# Patient Record
Sex: Male | Born: 1955
Health system: Southern US, Community
[De-identification: ages and names within clinical notes are randomized; demographics above are authoritative.]

## PROBLEM LIST (undated history)

## (undated) DIAGNOSIS — K219 Gastro-esophageal reflux disease without esophagitis: Secondary | ICD-10-CM

## (undated) DIAGNOSIS — E7800 Pure hypercholesterolemia, unspecified: Secondary | ICD-10-CM

## (undated) DIAGNOSIS — E119 Type 2 diabetes mellitus without complications: Secondary | ICD-10-CM

## (undated) DIAGNOSIS — I10 Essential (primary) hypertension: Secondary | ICD-10-CM

## (undated) HISTORY — PX: OTHER SURGICAL HISTORY: SHX169

## (undated) HISTORY — PX: ABDOMINOPLASTY: SUR9

## (undated) HISTORY — DX: Essential (primary) hypertension: I10

## (undated) HISTORY — PX: SPINE SURGERY: SHX786

## (undated) HISTORY — DX: Gastro-esophageal reflux disease without esophagitis: K21.9

## (undated) HISTORY — PX: BACK SURGERY: SHX140

---

## 2001-06-25 ENCOUNTER — Emergency Department (HOSPITAL_COMMUNITY): Admission: EM | Admit: 2001-06-25 | Discharge: 2001-06-25 | Payer: Self-pay | Admitting: Internal Medicine

## 2001-06-25 ENCOUNTER — Encounter: Payer: Self-pay | Admitting: Internal Medicine

## 2001-06-26 ENCOUNTER — Emergency Department (HOSPITAL_COMMUNITY): Admission: EM | Admit: 2001-06-26 | Discharge: 2001-06-26 | Payer: Self-pay

## 2001-06-28 ENCOUNTER — Encounter: Payer: Self-pay | Admitting: Orthopedic Surgery

## 2001-06-28 ENCOUNTER — Inpatient Hospital Stay (HOSPITAL_COMMUNITY): Admission: RE | Admit: 2001-06-28 | Discharge: 2001-07-01 | Payer: Self-pay | Admitting: Orthopedic Surgery

## 2001-06-28 ENCOUNTER — Encounter (INDEPENDENT_AMBULATORY_CARE_PROVIDER_SITE_OTHER): Payer: Self-pay | Admitting: Specialist

## 2003-06-20 ENCOUNTER — Emergency Department (HOSPITAL_COMMUNITY): Admission: EM | Admit: 2003-06-20 | Discharge: 2003-06-21 | Payer: Self-pay | Admitting: *Deleted

## 2009-08-16 ENCOUNTER — Ambulatory Visit (HOSPITAL_COMMUNITY): Admission: RE | Admit: 2009-08-16 | Discharge: 2009-08-16 | Payer: Self-pay | Admitting: Family Medicine

## 2010-01-09 ENCOUNTER — Encounter: Payer: Self-pay | Admitting: Internal Medicine

## 2010-01-17 ENCOUNTER — Ambulatory Visit (HOSPITAL_COMMUNITY): Admission: RE | Admit: 2010-01-17 | Discharge: 2010-01-17 | Payer: Self-pay | Admitting: Internal Medicine

## 2010-01-17 ENCOUNTER — Ambulatory Visit: Payer: Self-pay | Admitting: Internal Medicine

## 2010-09-16 NOTE — Letter (Signed)
Summary: Internal Other Domingo Dimes  Internal Other Domingo Dimes   Imported By: Cloria Spring LPN 16/05/9603 54:09:81  _____________________________________________________________________  External Attachment:    Type:   Image     Comment:   External Document

## 2010-11-03 LAB — GLUCOSE, CAPILLARY: Glucose-Capillary: 99 mg/dL (ref 70–99)

## 2010-11-10 ENCOUNTER — Other Ambulatory Visit: Payer: Self-pay | Admitting: Family Medicine
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
## 2012-07-01 ENCOUNTER — Ambulatory Visit: Payer: Self-pay | Admitting: Family Medicine

## 2012-07-01 ENCOUNTER — Encounter: Payer: Self-pay | Admitting: Family Medicine

## 2014-12-14 ENCOUNTER — Other Ambulatory Visit (HOSPITAL_COMMUNITY): Payer: Self-pay | Admitting: Family Medicine

## 2014-12-14 ENCOUNTER — Ambulatory Visit (HOSPITAL_COMMUNITY)
Admission: RE | Admit: 2014-12-14 | Discharge: 2014-12-14 | Disposition: A | Discharge status: Home / Self Care | Payer: BLUE CROSS/BLUE SHIELD | Source: Ambulatory Visit | Attending: Family Medicine | Admitting: Family Medicine

## 2014-12-14 DIAGNOSIS — M5432 Sciatica, left side: Secondary | ICD-10-CM

## 2014-12-14 DIAGNOSIS — S73102A Unspecified sprain of left hip, initial encounter: Secondary | ICD-10-CM

## 2014-12-14 DIAGNOSIS — Y939 Activity, unspecified: Secondary | ICD-10-CM | POA: Insufficient documentation

## 2014-12-14 DIAGNOSIS — M25552 Pain in left hip: Secondary | ICD-10-CM | POA: Insufficient documentation

## 2015-02-12 ENCOUNTER — Encounter: Payer: Self-pay | Admitting: Orthopedic Surgery

## 2016-10-02 DIAGNOSIS — I10 Essential (primary) hypertension: Secondary | ICD-10-CM | POA: Diagnosis not present

## 2016-10-02 DIAGNOSIS — E782 Mixed hyperlipidemia: Secondary | ICD-10-CM | POA: Diagnosis not present

## 2016-10-02 DIAGNOSIS — E1165 Type 2 diabetes mellitus with hyperglycemia: Secondary | ICD-10-CM | POA: Diagnosis not present

## 2016-10-16 DIAGNOSIS — E1165 Type 2 diabetes mellitus with hyperglycemia: Secondary | ICD-10-CM | POA: Diagnosis not present

## 2016-11-27 DIAGNOSIS — Z1389 Encounter for screening for other disorder: Secondary | ICD-10-CM | POA: Diagnosis not present

## 2016-11-27 DIAGNOSIS — I10 Essential (primary) hypertension: Secondary | ICD-10-CM | POA: Diagnosis not present

## 2016-11-27 DIAGNOSIS — E782 Mixed hyperlipidemia: Secondary | ICD-10-CM | POA: Diagnosis not present

## 2016-11-27 DIAGNOSIS — E1165 Type 2 diabetes mellitus with hyperglycemia: Secondary | ICD-10-CM | POA: Diagnosis not present

## 2018-04-29 DIAGNOSIS — E6609 Other obesity due to excess calories: Secondary | ICD-10-CM | POA: Diagnosis not present

## 2018-04-29 DIAGNOSIS — E782 Mixed hyperlipidemia: Secondary | ICD-10-CM | POA: Diagnosis not present

## 2018-04-29 DIAGNOSIS — E119 Type 2 diabetes mellitus without complications: Secondary | ICD-10-CM | POA: Diagnosis not present

## 2018-04-29 DIAGNOSIS — E1121 Type 2 diabetes mellitus with diabetic nephropathy: Secondary | ICD-10-CM | POA: Diagnosis not present

## 2018-04-29 DIAGNOSIS — I10 Essential (primary) hypertension: Secondary | ICD-10-CM | POA: Diagnosis not present

## 2018-11-04 DIAGNOSIS — E6609 Other obesity due to excess calories: Secondary | ICD-10-CM | POA: Diagnosis not present

## 2018-11-04 DIAGNOSIS — I10 Essential (primary) hypertension: Secondary | ICD-10-CM | POA: Diagnosis not present

## 2018-11-04 DIAGNOSIS — E119 Type 2 diabetes mellitus without complications: Secondary | ICD-10-CM | POA: Diagnosis not present

## 2019-06-27 ENCOUNTER — Other Ambulatory Visit: Payer: Self-pay

## 2019-06-27 ENCOUNTER — Ambulatory Visit
Admission: EM | Admit: 2019-06-27 | Discharge: 2019-06-27 | Disposition: A | Discharge status: Home / Self Care | Payer: 59

## 2019-06-27 DIAGNOSIS — M79601 Pain in right arm: Secondary | ICD-10-CM

## 2019-06-27 DIAGNOSIS — M25521 Pain in right elbow: Secondary | ICD-10-CM

## 2019-06-27 HISTORY — DX: Type 2 diabetes mellitus without complications: E11.9

## 2019-06-27 HISTORY — DX: Pure hypercholesterolemia, unspecified: E78.00

## 2019-06-27 MED ORDER — PREDNISONE 20 MG PO TABS: 20.0000 mg | ORAL_TABLET | Freq: Two times a day (BID) | ORAL | 0 refills | Status: AC
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
## 2019-06-27 NOTE — ED Provider Notes (Signed)
[place]   [id number] 06/27/19 Arrival Time: 1010  CC: RT arm pain  SUBJECTIVE: History from: patient. Stanley Long is a 63 y.o. male hx significant for DM, complains of right upper arm pain that began 1 day ago.  Denies a precipitating event or specific injury, however, speculates he may have slept on it wrong. Also admits to moving a deer that weight approximately 220 lbs 4 days ago.  Localizes the pain to the upper arm and outside of elbow.  Describes the pain as constant and "locking" in character.  Has NOT tried OTC medications.  Symptoms are made worse with ROM about the elbow.  Denies similar symptoms in the past.  Complains of swelling, and weakness.  Denies fever, chills, erythema, ecchymosis, numbness and tingling.    ROS: As per HPI.  All other pertinent ROS negative.     Past Medical History:  Diagnosis Date  . Diabetes mellitus without complication ([place])   . Hypercholesteremia    History reviewed. No pertinent surgical history. No Known Allergies No current facility-administered medications on file prior to encounter.    Current Outpatient Medications on File Prior to Encounter  Medication Sig Dispense Refill  . metFORMIN (GLUCOPHAGE) 500 MG tablet Take 500 mg by mouth 2 (two) times daily with a meal.    . glipiZIDE (GLUCOTROL XL) 10 MG 24 hr tablet Take 10 mg by mouth every morning.     Social History   Socioeconomic History  . Marital status: Married    Spouse name: Not on file  . Number of children: Not on file  . Years of education: Not on file  . Highest education level: Not on file  Occupational History  . Not on file  Social Needs  . Financial resource strain: Not on file  . Food insecurity    Worry: Not on file    Inability: Not on file  . Transportation needs    Medical: Not on file    Non-medical: Not on file  Tobacco Use  . Smoking status: Never Smoker  . Smokeless tobacco: Never Used  Substance and Sexual Activity  . Alcohol  use: Not Currently  . Drug use: Never  . Sexual activity: Not on file  Lifestyle  . Physical activity    Days per week: Not on file    Minutes per session: Not on file  . Stress: Not on file  Relationships  . Social Herbalist on phone: Not on file    Gets together: Not on file    Attends religious service: Not on file    Active member of club or organization: Not on file    Attends meetings of clubs or organizations: Not on file    Relationship status: Not on file  . Intimate partner violence    Fear of current or ex partner: Not on file    Emotionally abused: Not on file    Physically abused: Not on file    Forced sexual activity: Not on file  Other Topics Concern  . Not on file  Social History Narrative  . Not on file   Family History  Problem Relation Age of Onset  . Diabetes Mother   . Hypertension Mother   . Diabetes Father     OBJECTIVE:  Vitals:   06/27/19 1028  BP: 127/70  Pulse: 64  Resp: 20  Temp: 98.4 F (36.9 C)  SpO2: 97%    General appearance: ALERT; in no acute  distress.  Head: NCAT Lungs: Normal respiratory effort; CTAB CV: RRR Musculoskeletal: RT arm Inspection: Skin warm, dry, clear and intact without obvious erythema, effusion, or ecchymosis.  Palpation: diffusely TTP over lateral upper arm, and lateral elbow ROM: LROM about the shoulder and elbow; difficulty with pronation and supination as welll Strength: 4/5 shld abduction, 5/5 shld adduction, 3/5 elbow flexion, 3/5 elbow extension, 5/5 grip strength Skin: warm and dry Neurologic: Ambulates without difficulty; Sensation intact about the upper extremities Psychological: alert and cooperative; normal mood and affect  ASSESSMENT & PLAN:  1. Right arm pain   2. Right elbow pain     Meds ordered this encounter  Medications  . predniSONE (DELTASONE) 20 MG tablet    Sig: Take 1 tablet (20 mg total) by mouth 2 (two) times daily with a meal for 5 days.    Dispense:  10 tablet     Refill:  0    Order Specific Question:   Supervising Provider    Answer:   Eustace Moore [[id number]]   Declines x-rays today Continue conservative management of rest, ice, and elevation Sling given, wear as needed for comfort, but take out frequently to stretch We will trial a short course of prednisone Return or follow up with PCP if symptoms persist Return or go to the ER if you have any new or worsening symptoms (fever, chills, redness, swelling, bruising, worsening symptoms despite medication, etc...)   Reviewed expectations re: course of current medical issues. Questions answered. Outlined signs and symptoms indicating need for more acute intervention. Patient verbalized understanding. After Visit Summary given.    Rennis Harding, PA-C 06/27/19 1131

## 2019-06-27 NOTE — Discharge Instructions (Signed)
Declines x-rays today Continue conservative management of rest, ice, and elevation Sling given, wear as needed for comfort, but take out frequently to stretch We will trial a short course of prednisone Return or follow up with PCP if symptoms persist Return or go to the ER if you have any new or worsening symptoms (fever, chills, redness, swelling, bruising, worsening symptoms despite medication, etc...)

## 2019-07-21 ENCOUNTER — Other Ambulatory Visit: Payer: Self-pay

## 2019-07-21 ENCOUNTER — Ambulatory Visit
Admission: EM | Admit: 2019-07-21 | Discharge: 2019-07-21 | Disposition: A | Discharge status: Home / Self Care | Payer: 59 | Attending: Emergency Medicine | Admitting: Emergency Medicine

## 2019-07-21 DIAGNOSIS — M545 Low back pain, unspecified: Secondary | ICD-10-CM

## 2019-07-21 MED ORDER — KETOROLAC TROMETHAMINE 30 MG/ML IJ SOLN
30.0000 mg | Freq: Once | INTRAMUSCULAR | Status: AC
  Administered 2019-07-21: 30 mg via INTRAMUSCULAR

## 2019-07-21 MED ORDER — TIZANIDINE HCL 2 MG PO CAPS: 2.0000 mg | ORAL_CAPSULE | Freq: Every evening | ORAL | 0 refills | Status: DC | PRN

## 2019-07-21 MED ORDER — PREDNISONE 20 MG PO TABS: 20.0000 mg | ORAL_TABLET | Freq: Two times a day (BID) | ORAL | 0 refills | Status: AC

## 2019-07-21 NOTE — ED Triage Notes (Signed)
Pt presents with right lower back pain that developed on Friday after injuring on job pulling cable

## 2019-07-21 NOTE — Discharge Instructions (Addendum)
Steroid shot given in office Continue conservative management of rest, ice, heat and gentle stretches/ massages Prednisone prescribed take as directed and to completion Take tizanidine at nighttime for symptomatic relief. Avoid driving or operating heavy machinery while using medication. Follow up with PCP if symptoms persist Return or go to the ER if you have any new or worsening symptoms (fever, chills, chest pain, abdominal pain, changes in bowel or bladder habits, pain radiating into lower legs, etc...)

## 2019-07-21 NOTE — ED Provider Notes (Signed)
[facility]   [id number] 07/21/19 Arrival Time: 1003  CC: Low back pain  SUBJECTIVE: History from: patient. Stanley Long is a 63 y.o. male hx significant of DM, and hypercholesteremia, complains of right low back pain that began 1 week ago.  Symptoms began after pulling cord. Localizes the pain to the RT low back.  Describes the pain as constant, achy, and intermittently sharp in character.  Has tried OTC medications without relief.  Symptoms are made worse with standing from a sitting position.  Denies fever, chills, erythema, ecchymosis, effusion, weakness, numbness and tingling, saddle paresthesias, loss of bowel or bladder function, hematuria, dysuria, urinary frequency.      ROS: As per HPI.  All other pertinent ROS negative.     Past Medical History:  Diagnosis Date  . Diabetes mellitus without complication ([facility])   . Hypercholesteremia    History reviewed. No pertinent surgical history. No Known Allergies No current facility-administered medications on file prior to encounter.    Current Outpatient Medications on File Prior to Encounter  Medication Sig Dispense Refill  . glipiZIDE (GLUCOTROL XL) 10 MG 24 hr tablet Take 10 mg by mouth every morning.    . metFORMIN (GLUCOPHAGE) 500 MG tablet Take 500 mg by mouth 2 (two) times daily with a meal.     Social History   Socioeconomic History  . Marital status: Married    Spouse name: Not on file  . Number of children: Not on file  . Years of education: Not on file  . Highest education level: Not on file  Occupational History  . Not on file  Social Needs  . Financial resource strain: Not on file  . Food insecurity    Worry: Not on file    Inability: Not on file  . Transportation needs    Medical: Not on file    Non-medical: Not on file  Tobacco Use  . Smoking status: Never Smoker  . Smokeless tobacco: Never Used  Substance and Sexual Activity  . Alcohol use: Not Currently  . Drug use: Never  . Sexual  activity: Not on file  Lifestyle  . Physical activity    Days per week: Not on file    Minutes per session: Not on file  . Stress: Not on file  Relationships  . Social Musician on phone: Not on file    Gets together: Not on file    Attends religious service: Not on file    Active member of club or organization: Not on file    Attends meetings of clubs or organizations: Not on file    Relationship status: Not on file  . Intimate partner violence    Fear of current or ex partner: Not on file    Emotionally abused: Not on file    Physically abused: Not on file    Forced sexual activity: Not on file  Other Topics Concern  . Not on file  Social History Narrative  . Not on file   Family History  Problem Relation Age of Onset  . Diabetes Mother   . Hypertension Mother   . Diabetes Father     OBJECTIVE:  Vitals:   07/21/19 1012  BP: (!) 166/80  Pulse: 72  Resp: 18  Temp: 98.3 F (36.8 C)  SpO2: 96%    General appearance: ALERT; in no acute distress.  Head: NCAT Lungs: Normal respiratory effort; CTAB CV: RRR Musculoskeletal: Back  Inspection: Skin warm, dry, clear  and intact without obvious erythema, effusion, or ecchymosis.  Palpation: TTP over RT low back with palpable spasm, no midline tenderness ROM: FROM active and passive Strength: 5/5 shld abduction, 5/5 shld adduction, 5/5 elbow flexion, 5/5 elbow extension, 5/5 grip strength, 5/5 hip flexion, 5/5 knee abduction, 5/5 knee adduction, 5/5 knee flexion, 5/5 knee extension Skin: warm and dry Neurologic: Ambulates without difficulty; Sensation intact about the upper/ lower extremities Psychological: alert and cooperative; normal mood and affect  ASSESSMENT & PLAN:  1. Acute right-sided low back pain without sciatica    Meds ordered this encounter  Medications  . predniSONE (DELTASONE) 20 MG tablet    Sig: Take 1 tablet (20 mg total) by mouth 2 (two) times daily with a meal for 5 days.    Dispense:   10 tablet    Refill:  0    Order Specific Question:   Supervising Provider    Answer:   Raylene Everts [[id number]]  . ketorolac (TORADOL) 30 MG/ML injection 30 mg  . tizanidine (ZANAFLEX) 2 MG capsule    Sig: Take 1 capsule (2 mg total) by mouth at bedtime as needed for muscle spasms.    Dispense:  15 capsule    Refill:  0    Order Specific Question:   Supervising Provider    Answer:   Raylene Everts [[id number]]   Toradol shot given in office Continue conservative management of rest, ice, heat and gentle stretches/ massages Prednisone prescribed take as directed and to completion Take tizanidine at nighttime for symptomatic relief. Avoid driving or operating heavy machinery while using medication. Follow up with PCP if symptoms persist Return or go to the ER if you have any new or worsening symptoms (fever, chills, chest pain, abdominal pain, changes in bowel or bladder habits, pain radiating into lower legs, etc...)    Reviewed expectations re: course of current medical issues. Questions answered. Outlined signs and symptoms indicating need for more acute intervention. Patient verbalized understanding. After Visit Summary given.    Lestine Box, PA-C 07/21/19 [phone]

## 2019-08-04 ENCOUNTER — Encounter: Payer: Self-pay | Admitting: Emergency Medicine

## 2019-08-04 ENCOUNTER — Other Ambulatory Visit: Payer: Self-pay

## 2019-08-04 ENCOUNTER — Ambulatory Visit
Admission: EM | Admit: 2019-08-04 | Discharge: 2019-08-04 | Disposition: A | Discharge status: Home / Self Care | Payer: 59 | Attending: Emergency Medicine | Admitting: Emergency Medicine

## 2019-08-04 DIAGNOSIS — X509XXA Other and unspecified overexertion or strenuous movements or postures, initial encounter: Secondary | ICD-10-CM | POA: Diagnosis not present

## 2019-08-04 DIAGNOSIS — M545 Low back pain, unspecified: Secondary | ICD-10-CM

## 2019-08-04 MED ORDER — PREDNISONE 10 MG PO TABS: 20.0000 mg | ORAL_TABLET | Freq: Every day | ORAL | 0 refills | Status: DC

## 2019-08-04 MED ORDER — CYCLOBENZAPRINE HCL 5 MG PO TABS: 5.0000 mg | ORAL_TABLET | Freq: Three times a day (TID) | ORAL | 0 refills | Status: DC | PRN

## 2019-08-04 MED ORDER — IBUPROFEN 400 MG PO TABS: 400.0000 mg | ORAL_TABLET | Freq: Four times a day (QID) | ORAL | 0 refills | Status: DC | PRN

## 2019-08-04 MED ORDER — KETOROLAC TROMETHAMINE 30 MG/ML IJ SOLN
30.0000 mg | Freq: Once | INTRAMUSCULAR | Status: AC
  Administered 2019-08-04: 30 mg via INTRAMUSCULAR

## 2019-08-04 MED ORDER — CYCLOBENZAPRINE HCL 5 MG PO TABS: 5.0000 mg | ORAL_TABLET | Freq: Three times a day (TID) | ORAL | Status: DC

## 2019-08-04 NOTE — ED Triage Notes (Signed)
Pt returns for continues back pain radiating into right leg

## 2019-08-04 NOTE — ED Provider Notes (Signed)
[facility]    CSN: [id number] Arrival date & time: 08/04/19  6295      History   Chief Complaint Chief Complaint  Patient presents with  . Back Pain    HPI Stanley Long is a 63 y.o. male.    Stanley Long 75 y old male with pertinent history of diabetes type 2 presented to the urgent care with a complaint of right back pain that began 22 days ago.  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  He localizes the pain to the right low back and rated at 6 on a scale of 1-10.  He describes the pain as constant and achy and doesn't radiate.  He has tried prednisone, Zanaflex medications with  moderate relief. He stated he is out of his medication and the pain has returned. His symptoms are made worse with movement.  He denies similar symptoms in the past.     The history is provided by the patient. No language interpreter was used.    Past Medical History:  Diagnosis Date  . Diabetes mellitus without complication (HCC)   . Hypercholesteremia     There are no problems to display for this patient.   History reviewed. No pertinent surgical history.     Home Medications    Prior to Admission medications   Medication Sig Start Date End Date Taking? Authorizing Provider  cyclobenzaprine (FLEXERIL) 5 MG tablet Take 1 tablet (5 mg total) by mouth 3 (three) times daily as needed for muscle spasms. 08/04/19   Saher Davee, Zachery Dakins, FNP  glipiZIDE (GLUCOTROL XL) 10 MG 24 hr tablet Take 10 mg by mouth every morning. 06/23/19   [provider]  ibuprofen (ADVIL) 400 MG tablet Take 1 tablet (400 mg total) by mouth every 6 (six) hours as needed. 08/04/19   Madason Rauls, Zachery Dakins, FNP  metFORMIN (GLUCOPHAGE) 500 MG tablet Take 500 mg by mouth 2 (two) times daily with a meal.    [provider]  predniSONE (DELTASONE) 10 MG tablet Take 2 tablets (20 mg total) by mouth daily. 08/04/19   Isabellarose Kope, Zachery Dakins, FNP  tizanidine (ZANAFLEX) 2 MG capsule Take 1 capsule (2 mg total) by  mouth at bedtime as needed for muscle spasms. 07/21/19   Rennis Harding, PA-C    Family History Family History  Problem Relation Age of Onset  . Diabetes Mother   . Hypertension Mother   . Diabetes Father     Social History Social History   Tobacco Use  . Smoking status: Never Smoker  . Smokeless tobacco: Never Used  Substance Use Topics  . Alcohol use: Not Currently  . Drug use: Never     Allergies   Patient has no known allergies.   Review of Systems Review of Systems  Constitutional: Negative.   Respiratory: Negative.   Cardiovascular: Negative.   Musculoskeletal: Positive for back pain.  Neurological: Negative.   ROS: All other are negatives  Physical Exam Triage Vital Signs ED Triage Vitals  Enc Vitals Group     BP      Pulse      Resp      Temp      Temp src      SpO2      Weight      Height      Head Circumference      Peak Flow      Pain Score      Pain Loc  Pain Edu?      Excl. in [place]?    No data found.  Updated Vital Signs BP (!) 157/83   Pulse 67   Temp 98 F (36.7 C)   Resp 18   SpO2 95%   Visual Acuity Right Eye Distance:   Left Eye Distance:   Bilateral Distance:    Right Eye Near:   Left Eye Near:    Bilateral Near:     Physical Exam Vitals and nursing note reviewed.  Constitutional:      General: He is not in acute distress.    Appearance: Normal appearance. He is not ill-appearing or toxic-appearing.  Cardiovascular:     Pulses: Normal pulses.     Heart sounds: Normal heart sounds. No murmur.  Pulmonary:     Effort: Pulmonary effort is normal. No respiratory distress.     Breath sounds: Normal breath sounds.  Chest:     Chest wall: No tenderness.  Musculoskeletal:        General: Tenderness present. No swelling or signs of injury. Normal range of motion.     Right lower leg: No edema.  Neurological:     Mental Status: He is alert and oriented to person, place, and time.     Cranial Nerves: Cranial nerves  are intact.     Sensory: Sensation is intact. No sensory deficit.     Motor: Motor function is intact. No weakness.     Coordination: Coordination is intact.     Gait: Gait is intact. Gait normal.     Deep Tendon Reflexes:     Reflex Scores:      Patellar reflexes are 2+ on the right side and 2+ on the left side.     UC Treatments / Results  Labs (all labs ordered are listed, but only abnormal results are displayed) Labs Reviewed - No data to display  EKG   Radiology No results found.  Procedures Procedures (including critical care time)  Medications Ordered in UC Medications  ketorolac (TORADOL) 30 MG/ML injection 30 mg (30 mg Intramuscular Given 08/04/19 1015)    Initial Impression / Assessment and Plan / UC Course  I have reviewed the triage vital signs and the nursing notes.  Pertinent labs & imaging results that were available during my care of the patient were reviewed by me and considered in my medical decision making (see chart for details).    Patient stable for discharge.  Benign physical exam.  PE consistent with back spasm and pain..  Prescribed ibuprofen, Flexeril, and prednisone short-term.  Instructed patient to rest, ice, heat and gentle stretches.  To return or follow up with PCP if symptoms persists.  New or worsening symptoms will return or go to ER.    Final Clinical Impressions(s) / UC Diagnoses   Final diagnoses:  Acute right-sided low back pain without sciatica     Discharge Instructions     Toradol shot was given in office Advised patient to take prednisone as prescribed Advised patient to take Flexeril as prescribed.  Patient was made aware that it may make him sleepy Continue to follow RICE instruction that is attached To follow-up with primary care or neurology if symptoms worsening or go to ED    ED Prescriptions    Medication Sig Dispense Auth. Provider   predniSONE (DELTASONE) 10 MG tablet Take 2 tablets (20 mg total) by mouth  daily. 15 tablet Antonin Meininger S, FNP   ibuprofen (ADVIL) 400 MG tablet Take 1  tablet (400 mg total) by mouth every 6 (six) hours as needed. 30 tablet Mcgregor Tinnon, Zachery DakinsKomlanvi S, FNP   cyclobenzaprine (FLEXERIL) 5 MG tablet Take 1 tablet (5 mg total) by mouth 3 (three) times daily as needed for muscle spasms. 30 tablet Conny Situ, Zachery DakinsKomlanvi S, FNP     PDMP not reviewed this encounter.   Durward Parcelvegno, Yailene Badia S, FNP 08/04/19 [phone]

## 2019-08-04 NOTE — Discharge Instructions (Addendum)
Toradol shot was given in office Advised patient to take prednisone as prescribed Advised patient to take Flexeril as prescribed.  Patient was made aware that it may make him sleepy Continue to follow RICE instruction that is attached To follow-up with primary care or neurology if symptoms worsening or go to ED

## 2019-12-25 ENCOUNTER — Encounter: Payer: Self-pay | Admitting: Emergency Medicine

## 2019-12-25 ENCOUNTER — Ambulatory Visit (INDEPENDENT_AMBULATORY_CARE_PROVIDER_SITE_OTHER): Payer: 59

## 2019-12-25 ENCOUNTER — Other Ambulatory Visit: Payer: Self-pay

## 2019-12-25 ENCOUNTER — Telehealth: Payer: Self-pay | Admitting: Orthopedic Surgery

## 2019-12-25 ENCOUNTER — Ambulatory Visit
Admission: EM | Admit: 2019-12-25 | Discharge: 2019-12-25 | Disposition: A | Discharge status: Home / Self Care | Payer: 59 | Attending: Emergency Medicine | Admitting: Emergency Medicine

## 2019-12-25 DIAGNOSIS — M25561 Pain in right knee: Secondary | ICD-10-CM | POA: Diagnosis not present

## 2019-12-25 MED ORDER — NAPROXEN 500 MG PO TABS: 500.0000 mg | ORAL_TABLET | Freq: Two times a day (BID) | ORAL | 0 refills | Status: DC

## 2019-12-25 NOTE — ED Triage Notes (Signed)
Reports he has bilateral knee issues.  Right knee is the worst and is causing him issues.  Patient is unable to straighten his right leg.  This occurred when stepping up on steps.  Patient 's right knee is swollen

## 2019-12-25 NOTE — Telephone Encounter (Signed)
Call received from patient at end of Dr Harrison's clinic today, following visit today at [facility], [place], for problem of acute right knee pain. Patient said he is in a brace but cannot bend or straighten the knee. Please advise based on current provider schedules - patient said he can come tomorrow.

## 2019-12-25 NOTE — ED Provider Notes (Signed)
[facility]   [id number] 12/25/19 Arrival Time: 0940  CC: Bilateral knee pain R>L  SUBJECTIVE: History from: patient. Stanley Long is a 64 y.o. male complains of bilateral knee R>L x 1 day.  Denies a precipitating event or specific injury.  Localizes the pain to the "middle of knee."  Describes the pain as intermittent.  "10"/10 at its worse.  Has tried using cane with minimal relief.  Symptoms are made worse with straightening leg and walking.  Reports previous knee problems in the past from playing football.  Has never seen a knee specialist.  Complains of associated weakness.  Denies fever, chills, erythema, ecchymosis, effusion, numbness and tingling.    ROS: As per HPI.  All other pertinent ROS negative.     Past Medical History:  Diagnosis Date  . Diabetes mellitus without complication ([place])   . Hypercholesteremia    History reviewed. No pertinent surgical history. No Known Allergies No current facility-administered medications on file prior to encounter.   Current Outpatient Medications on File Prior to Encounter  Medication Sig Dispense Refill  . cyclobenzaprine (FLEXERIL) 5 MG tablet Take 1 tablet (5 mg total) by mouth 3 (three) times daily as needed for muscle spasms. 30 tablet 0  . glipiZIDE (GLUCOTROL XL) 10 MG 24 hr tablet Take 10 mg by mouth every morning.    . metFORMIN (GLUCOPHAGE) 500 MG tablet Take 500 mg by mouth 2 (two) times daily with a meal.    . ibuprofen (ADVIL) 400 MG tablet Take 1 tablet (400 mg total) by mouth every 6 (six) hours as needed. 30 tablet 0   Social History   Socioeconomic History  . Marital status: Married    Spouse name: Not on file  . Number of children: Not on file  . Years of education: Not on file  . Highest education level: Not on file  Occupational History  . Not on file  Tobacco Use  . Smoking status: Never Smoker  . Smokeless tobacco: Never Used  Substance and Sexual Activity  . Alcohol use: Not Currently  . Drug  use: Never  . Sexual activity: Not on file  Other Topics Concern  . Not on file  Social History Narrative  . Not on file   Social Determinants of Health   Financial Resource Strain:   . Difficulty of Paying Living Expenses:   Food Insecurity:   . Worried About Charity fundraiser in the Last Year:   . Arboriculturist in the Last Year:   Transportation Needs:   . Film/video editor (Medical):   Marland Kitchen Lack of Transportation (Non-Medical):   Physical Activity:   . Days of Exercise per Week:   . Minutes of Exercise per Session:   Stress:   . Feeling of Stress :   Social Connections:   . Frequency of Communication with Friends and Family:   . Frequency of Social Gatherings with Friends and Family:   . Attends Religious Services:   . Active Member of Clubs or Organizations:   . Attends Archivist Meetings:   Marland Kitchen Marital Status:   Intimate Partner Violence:   . Fear of Current or Ex-Partner:   . Emotionally Abused:   Marland Kitchen Physically Abused:   . Sexually Abused:    Family History  Problem Relation Age of Onset  . Diabetes Mother   . Hypertension Mother   . Diabetes Father     OBJECTIVE:  Vitals:   12/25/19 1001  BP: 127/77  Pulse: 60  Resp: 16  Temp: 97.9 F (36.6 C)  TempSrc: Oral  SpO2: 96%    General appearance: ALERT; in no acute distress.  Head: NCAT Lungs: Normal respiratory effort Musculoskeletal: RT knee Inspection: Skin warm, dry, clear and intact without obvious erythema, effusion, or ecchymosis.  Palpation: mildly TTP over anterior knee ROM: LROM about the RT knee Strength: 5/5 hip flexion, 5/5 hip extension, 5/5 knee abduction, 5/5 knee adduction, 5/5 knee flexion, 3/5 knee extension Skin: warm and dry Neurologic: Sitting in wheelchair; Sensation intact about the lower extremities Psychological: alert and cooperative; normal mood and affect  DIAGNOSTIC STUDIES:  DG Knee Complete 4 Views Right  Result Date: 12/25/2019 CLINICAL DATA:  Right  knee pain with catching. EXAM: RIGHT KNEE - COMPLETE 4+ VIEW COMPARISON:  None. FINDINGS: The mineralization and alignment are normal. There is no evidence of acute fracture or dislocation. Mild tricompartmental degenerative changes are greatest within the patellofemoral compartment. No erosive changes or significant joint effusion. IMPRESSION: Mild tricompartmental degenerative changes, greatest within the patellofemoral compartment. Electronically Signed   By: Carey Bullocks M.D.   On: 12/25/2019 10:45    X-rays negative for fracture, or dislocation.   I have reviewed the x-rays myself and the radiologist interpretation. I am in agreement with the radiologist interpretation.     ASSESSMENT & PLAN:  1. Acute pain of right knee     Meds ordered this encounter  Medications  . naproxen (NAPROSYN) 500 MG tablet    Sig: Take 1 tablet (500 mg total) by mouth 2 (two) times daily.    Dispense:  30 tablet    Refill:  0    Order Specific Question:   Supervising Provider    Answer:   Eustace Moore [[id number]]   X-rays negative for fracture or dislocation, but did show degenerative changes Continue conservative management of rest, ice, and elevation Brace given in office Take naproxen as needed for pain relief (may cause abdominal discomfort, ulcers, and GI bleeds avoid taking with other NSAIDs) Follow up with orthopedist for further evaluation and management Return or go to the ER if you have any new or worsening symptoms (fever, chills, chest pain, redness, swelling, bruising, deformity, worsening symptoms despite treatment, etc...)   Reviewed expectations re: course of current medical issues. Questions answered. Outlined signs and symptoms indicating need for more acute intervention. Patient verbalized understanding. After Visit Summary given.    Rennis Harding, PA-C 12/25/19 1103

## 2019-12-25 NOTE — Telephone Encounter (Signed)
Please schedule with Dr Hilda Lias tomorrow.

## 2019-12-25 NOTE — Telephone Encounter (Signed)
I have called back to patient at all 3 numbers - none go through to Stanley Long.

## 2019-12-25 NOTE — Discharge Instructions (Signed)
X-rays negative for fracture or dislocation, but did show degenerative changes Continue conservative management of rest, ice, and elevation Brace given in office Take naproxen as needed for pain relief (may cause abdominal discomfort, ulcers, and GI bleeds avoid taking with other NSAIDs) Follow up with orthopedist for further evaluation and management Return or go to the ER if you have any new or worsening symptoms (fever, chills, chest pain, redness, swelling, bruising, deformity, worsening symptoms despite treatment, etc...)

## 2019-12-26 ENCOUNTER — Encounter: Payer: Self-pay | Admitting: Orthopedic Surgery

## 2019-12-26 ENCOUNTER — Ambulatory Visit (INDEPENDENT_AMBULATORY_CARE_PROVIDER_SITE_OTHER): Payer: 59 | Admitting: Orthopedic Surgery

## 2019-12-26 VITALS — BP 134/76 | HR 65 | Ht 68.0 in | Wt 220.0 lb

## 2019-12-26 DIAGNOSIS — M25561 Pain in right knee: Secondary | ICD-10-CM

## 2019-12-26 DIAGNOSIS — M1711 Unilateral primary osteoarthritis, right knee: Secondary | ICD-10-CM | POA: Diagnosis not present

## 2019-12-26 DIAGNOSIS — M1710 Unilateral primary osteoarthritis, unspecified knee: Secondary | ICD-10-CM

## 2019-12-26 DIAGNOSIS — G8929 Other chronic pain: Secondary | ICD-10-CM

## 2019-12-26 NOTE — Patient Instructions (Signed)
Out of work one week Continue ice to knee, knee brace and the naprosyn

## 2019-12-26 NOTE — Telephone Encounter (Signed)
Called back to patient today to ph [phone]; left voice message (to offer appointment for today, 12/26/19) per [facility] note)

## 2019-12-26 NOTE — Telephone Encounter (Signed)
Patient agreed to see Dr Romeo Apple specifically; scheduled. Aware of appointment.

## 2019-12-26 NOTE — Progress Notes (Signed)
NEW PROBLEM//OFFICE VISIT  Chief Complaint  Patient presents with  . Knee Pain    right/ locked up on 12/24/19 increased pain since but has had pain since early childhood     64 year old male with chronic knee problems since he was a teenager has frequent giving out episodes and locking and catching both knees right greater than left.  He has diabetes.  He went to the ER yesterday after experiencing catching sensation right knee on Sunday, May 9.  He was able to put the knee back in place he says by crossing his legs  At the urgent care he had brace prescribed Naprosyn told to ice it he was taken out of work told to see an orthopedist and is here today for that evaluation  Review of systems he denies chest pain shortness of breath reports some frequency does not have any difficulty urinating occasional pollen allergy no abdominal pain sinusitis and headache     ROS   Past Medical History:  Diagnosis Date  . Diabetes mellitus without complication (HCC)   . Hypercholesteremia     History reviewed. No pertinent surgical history.  Family History  Problem Relation Age of Onset  . Diabetes Mother   . Hypertension Mother   . Diabetes Father    Social History   Tobacco Use  . Smoking status: Never Smoker  . Smokeless tobacco: Never Used  Substance Use Topics  . Alcohol use: Not Currently  . Drug use: Never    No Known Allergies  Current Meds  Medication Sig  . cyclobenzaprine (FLEXERIL) 5 MG tablet Take 1 tablet (5 mg total) by mouth 3 (three) times daily as needed for muscle spasms.  Marland Kitchen glipiZIDE (GLUCOTROL XL) 10 MG 24 hr tablet Take 10 mg by mouth every morning.  Marland Kitchen ibuprofen (ADVIL) 400 MG tablet Take 1 tablet (400 mg total) by mouth every 6 (six) hours as needed.  . metFORMIN (GLUCOPHAGE) 500 MG tablet Take 500 mg by mouth 2 (two) times daily with a meal.  . naproxen (NAPROSYN) 500 MG tablet Take 1 tablet (500 mg total) by mouth 2 (two) times daily.    BP 134/76    Pulse 65   Ht 5\' 8"  (1.727 m)   Wt 220 lb (99.8 kg)   BMI 33.45 kg/m   Physical Exam Constitutional:      General: He is not in acute distress.    Appearance: He is well-developed.  Cardiovascular:     Comments: No peripheral edema Abdominal:     Tenderness: Left CVA tenderness:  .dx.  Skin:    General: Skin is warm and dry.  Neurological:     Mental Status: He is alert and oriented to person, place, and time.     Sensory: No sensory deficit.     Coordination: Coordination normal.     Gait: Gait normal.     Deep Tendon Reflexes: Reflexes are normal and symmetric.     Ortho Exam He has a cane and is in a wheelchair today and he was wheeled then.  He has a brace on his right knee  He has a small effusion in the right knee he has pain on the medial joint line to palpation his knee does not quite come to full extension seems to flex well past 105 degrees feels stable in all planes muscle tone remains normal.  Left knee revealed no effusion better range of motion normal strength   MEDICAL DECISION MAKING  A.  Encounter Diagnoses  Name Primary?  . Primary localized osteoarthritis of knee Yes  . Chronic pain of right knee     B. DATA ANALYSED:    IMAGING: Independent interpretation of images: The outside images shows 4 views of the knee he has mild arthritis in the joint primarily some patellofemoral disease   Orders:   Outside records reviewed:   C. MANAGEMENT  Injury/symptoms to acute to tell where this is going.  He has chronic knee pain recommend ice Naprosyn bracing rest  Reexamine the knee in 3 weeks    No orders of the defined types were placed in this encounter.     Arther Abbott, MD  12/26/2019 4:37 PM

## 2020-01-02 ENCOUNTER — Telehealth: Payer: Self-pay | Admitting: Orthopedic Surgery

## 2020-01-02 NOTE — Telephone Encounter (Signed)
Patient (and spouse) relay not doing much better; said not able to walk any better so does not think he can return to work on 01/02/20 as notes indicate. Please advise.

## 2020-01-02 NOTE — Telephone Encounter (Signed)
done

## 2020-01-03 ENCOUNTER — Encounter: Payer: Self-pay | Admitting: Orthopedic Surgery

## 2020-01-03 ENCOUNTER — Encounter: Payer: Self-pay | Admitting: Internal Medicine

## 2020-01-03 NOTE — Telephone Encounter (Signed)
Ok to extend work note until he returns to clinic on 01/16/20?

## 2020-01-03 NOTE — Telephone Encounter (Signed)
yes

## 2020-01-05 NOTE — Telephone Encounter (Signed)
Note issued to patient on same day.

## 2020-01-16 ENCOUNTER — Other Ambulatory Visit: Payer: Self-pay

## 2020-01-16 ENCOUNTER — Encounter: Payer: Self-pay | Admitting: Orthopedic Surgery

## 2020-01-16 ENCOUNTER — Ambulatory Visit (INDEPENDENT_AMBULATORY_CARE_PROVIDER_SITE_OTHER): Payer: 59 | Admitting: Orthopedic Surgery

## 2020-01-16 VITALS — BP 141/73 | HR 88 | Ht 68.0 in | Wt 194.0 lb

## 2020-01-16 DIAGNOSIS — M25561 Pain in right knee: Secondary | ICD-10-CM | POA: Diagnosis not present

## 2020-01-16 DIAGNOSIS — G8929 Other chronic pain: Secondary | ICD-10-CM | POA: Diagnosis not present

## 2020-01-16 DIAGNOSIS — M1710 Unilateral primary osteoarthritis, unspecified knee: Secondary | ICD-10-CM

## 2020-01-16 DIAGNOSIS — M1711 Unilateral primary osteoarthritis, right knee: Secondary | ICD-10-CM

## 2020-01-16 MED ORDER — TRAMADOL-ACETAMINOPHEN 37.5-325 MG PO TABS: 1.0000 | ORAL_TABLET | ORAL | 0 refills | Status: AC | PRN

## 2020-01-16 NOTE — Patient Instructions (Signed)
OOW X 2 WEEKS 

## 2020-01-16 NOTE — Progress Notes (Signed)
Chief Complaint  Patient presents with  . Knee Pain    right knee painful to get out of chair, locking and cant walk without cane / gives out   Mr. Dunlap presents back now with 2 months history of painful catching and locking of his right knee he was treated with ibuprofen naproxen home exercise with bracing and a cane did not improve and another locking episode  Based on his examination  Ortho Exam He has a cane and is in a wheelchair today and he was wheeled then.  He has a brace on his right knee   He has a small effusion in the right knee he has pain on the medial joint line to palpation his knee does not quite come to full extension seems to flex well past 105 degrees feels stable in all planes muscle tone remains normal.  Left knee revealed no effusion better range of motion normal strength.  Recommend MRI right knee differential diagnosis displaced bucket meniscal tear, loose body.  Recommend Ultracet every 6 for pain  Out of work 2 weeks  Continue cane and continue brace  Encounter Diagnoses  Name Primary?  . Primary localized osteoarthritis of knee Yes  . Chronic pain of right knee       Meds ordered this encounter  Medications  . traMADol-acetaminophen (ULTRACET) 37.5-325 MG tablet    Sig: Take 1 tablet by mouth every 4 (four) hours as needed for up to 5 days.    Dispense:  30 tablet    Refill:  0

## 2020-01-31 ENCOUNTER — Ambulatory Visit (HOSPITAL_COMMUNITY)
Admission: RE | Admit: 2020-01-31 | Discharge: 2020-01-31 | Disposition: A | Discharge status: Home / Self Care | Payer: 59 | Source: Ambulatory Visit | Attending: Orthopedic Surgery | Admitting: Orthopedic Surgery

## 2020-01-31 ENCOUNTER — Other Ambulatory Visit: Payer: Self-pay

## 2020-01-31 DIAGNOSIS — M1710 Unilateral primary osteoarthritis, unspecified knee: Secondary | ICD-10-CM | POA: Diagnosis present

## 2020-02-01 ENCOUNTER — Encounter: Payer: Self-pay | Admitting: Orthopedic Surgery

## 2020-02-01 ENCOUNTER — Ambulatory Visit (INDEPENDENT_AMBULATORY_CARE_PROVIDER_SITE_OTHER): Payer: 59 | Admitting: Orthopedic Surgery

## 2020-02-01 VITALS — BP 139/80 | HR 70 | Ht 68.0 in | Wt 194.0 lb

## 2020-02-01 DIAGNOSIS — M1710 Unilateral primary osteoarthritis, unspecified knee: Secondary | ICD-10-CM

## 2020-02-01 DIAGNOSIS — G8929 Other chronic pain: Secondary | ICD-10-CM

## 2020-02-01 DIAGNOSIS — M23300 Other meniscus derangements, unspecified lateral meniscus, right knee: Secondary | ICD-10-CM | POA: Diagnosis not present

## 2020-02-01 DIAGNOSIS — M25561 Pain in right knee: Secondary | ICD-10-CM

## 2020-02-01 NOTE — Progress Notes (Signed)
Chief Complaint  Patient presents with  . Knee Pain    right / review MRI    64 year old male diabetic chronic right knee pain catching locking feels like the knee is coming out of place presents back for MRI follow-up  He was treated nonoperatively with bracing anti-inflammatories and did not improve  His MRI shows osteoarthritis of his patella and a torn lateral meniscus  We discussed this at length including preop consultation and risk-benefit ratio  Patient agrees to arthroscopy right knee and partial lateral meniscectomy  Out of work 4 weeks  Past Medical History:  Diagnosis Date  . Diabetes mellitus without complication (HCC)   . Hypercholesteremia    History reviewed. No pertinent surgical history. Family History  Problem Relation Age of Onset  . Diabetes Mother   . Hypertension Mother   . Diabetes Father    Social History   Tobacco Use  . Smoking status: Never Smoker  . Smokeless tobacco: Never Used  Vaping Use  . Vaping Use: Never used  Substance Use Topics  . Alcohol use: Not Currently  . Drug use: Never   BP 139/80   Pulse 70   Ht 5\' 8"  (1.727 m)   Wt 194 lb (88 kg)   BMI 29.50 kg/m   Encounter Diagnoses  Name Primary?  . Primary localized osteoarthritis of knee Yes  . Chronic pain of right knee   . Meniscus, lateral, derangement, right     sark lat men

## 2020-02-01 NOTE — Patient Instructions (Signed)
Meniscus Injury, Arthroscopy   Arthroscopy is a surgical procedure that involves the use of a small scope that has a camera and surgical instruments on the end (arthroscope). An arthroscope can be used to repair your meniscus injury.  LET YOUR HEALTH CARE PROVIDER KNOW ABOUT:  Any allergies you have.  All medicines you are taking, including vitamins, herbs, eyedrops, creams, and over-the-counter medicines.  Any recent colds or infections you have had or currently have.  Previous problems you or members of your family have had with the use of anesthetics.  Any blood disorders or blood clotting problems you have.  Previous surgeries you have had.  Medical conditions you have. RISKS AND COMPLICATIONS Generally, this is a safe procedure. However, as with any procedure, problems can occur. Possible problems include:  Damage to nerves or blood vessels.  Excess bleeding.  Blood clots.  Infection. BEFORE THE PROCEDURE  Do not eat or drink for 6-8 hours before the procedure.  Take medicines as directed by your surgeon. Ask your surgeon about changing or stopping your regular medicines.  You may have lab tests the morning of surgery. PROCEDURE  You will be given one of the following:   A medicine that numbs the area (local anesthesia).  A medicine that makes you go to sleep (general anesthesia).  A medicine injected into your spine that numbs your body below the waist (spinal anesthesia). Most often, several small cuts (incisions) are made in the knee. The arthroscope and instruments go into the incisions to repair the damage. The torn portion of the meniscus is removed.   AFTER THE PROCEDURE  You will be taken to the recovery area where your progress will be monitored. When you are awake, stable, and taking fluids without complications, you will be allowed to go home. This is usually the same day. A torn or stretched ligament (ligament sprain) may take 6-8 weeks to heal.   It  takes about the 4-6 WEEKS if your surgeon removed a torn meniscus.  A repaired meniscus may require 6-12 weeks of recovery time.  A torn ligament needing reconstructive surgery may take 6-12 months to heal fully.   This information is not intended to replace advice given to you by your health care provider. Make sure you discuss any questions you have with your health care provider. You have decided to proceed with operative arthroscopy of the knee. You have decided not to continue with nonoperative measures such as but not limited to oral medication, weight loss, activity modification, physical therapy, bracing, or injection.  We will perform operative arthroscopy of the knee. Some of the risks associated with arthroscopic surgery of the knee include but are not limited to Bleeding Infection Swelling Stiffness Blood clot Pain Need for knee replacement surgery    In compliance with recent [place] law in federal regulation regarding opioid use and abuse and addiction, we will taper (stop) opioid medication after 2 weeks.  If you're not comfortable with these risks and would like to continue with nonoperative treatment please let Dr. Starsha Morning know prior to your surgery. 

## 2020-02-05 ENCOUNTER — Ambulatory Visit (HOSPITAL_COMMUNITY): Payer: 59

## 2020-02-05 NOTE — Patient Instructions (Signed)
Stanley Long  02/05/2020     @PREFPERIOPPHARMACY @   Your procedure is scheduled on  02/13/2020 .  Report to 02/15/2020 at  [phone]  A.M.  Call this number if you have problems the morning of surgery:  [phone]   Remember:  Do not eat or drink after midnight.                       Take these medicines the morning of surgery with A SIP OF WATER  None. DO NOT take any medications for diabetes the morning of your procedure.    Do not wear jewelry, make-up or nail polish.  Do not wear lotions, powders, or perfumes. Please wear deodorant and brush your teeth.  Do not shave 48 hours prior to surgery.  Men may shave face and neck.  Do not bring valuables to the hospital.  [facility] is not responsible for any belongings or valuables.  Contacts, dentures or bridgework may not be worn into surgery.  Leave your suitcase in the car.  After surgery it may be brought to your room.  For patients admitted to the hospital, discharge time will be determined by your treatment team.  Patients discharged the day of surgery will not be allowed to drive home.   Name and phone number of your driver:   family Special instructions:  DO NOT smoke the morning of your procedure.  Please read over the following fact sheets that you were given. Surgical Site Infection Prevention, Anesthesia Post-op Instructions and Care and Recovery After Surgery       Arthroscopic Knee Ligament Repair, Care After This sheet gives you information about how to care for yourself after your procedure. Your health care provider may also give you more specific instructions. If you have problems or questions, contact your health care provider. What can I expect after the procedure? After the procedure, it is common to have:  Pain in your knee.  Bruising and swelling on your knee, calf, and ankle for 3-4 days.  Fatigue. Follow these instructions at home: If you have a brace or immobilizer:  Wear the  brace or immobilizer as told by your health care provider. Remove it only as told by your health care provider.  Loosen the splint or immobilizer if your toes tingle, become numb, or turn cold and blue.  Keep the brace or immobilizer clean. Bathing  Do not take baths, swim, or use a hot tub until your health care provider approves. Ask your health care provider if you can take showers.  Keep your bandage (dressing) dry until your health care provider says that it can be removed. Cover it and your brace or immobilizer with a watertight covering when you take a shower. Incision care   Follow instructions from your health care provider about how to take care of your incision. Make sure you: ? Wash your hands with soap and water before you change your bandage (dressing). If soap and water are not available, use hand sanitizer. ? Change your dressing as told by your health care provider. ? Leave stitches (sutures), skin glue, or adhesive strips in place. These skin closures may need to stay in place for 2 weeks or longer. If adhesive strip edges start to loosen and curl up, you may trim the loose edges. Do not remove adhesive strips completely unless your health care provider tells you to do that.  Check  your incision area every day for signs of infection. Check for: ? More redness, swelling, or pain. ? More fluid or blood. ? Warmth. ? Pus or a bad smell. Managing pain, stiffness, and swelling   If directed, put ice on the affected area. ? If you have a removable brace or immobilizer, remove it as told by your health care provider. ? Put ice in a plastic bag. ? Place a towel between your skin and the bag or between your brace or immobilizer and the bag. ? Leave the ice on for 20 minutes, 2-3 times a day.  Move your toes often to avoid stiffness and to lessen swelling.  Raise (elevate) the injured area above the level of your heart while you are sitting or lying down. Driving  Do not  drive until your health care provider approves. If you have a brace or immobilizer on your leg, ask your health care provider when it is safe for you to drive.  Do not drive or use heavy machinery while taking prescription pain medicine. Activity  Rest as directed. Ask your health care provider what activities are safe for you.  Do physical therapy exercises as told by your health care provider. Physical therapy will help you regain strength and motion in your knee.  Follow instructions from your health care provider about: ? When you may start motion exercises. ? When you may start riding a stationary bike and doing other low-impact activities. ? When you may start to jog and do other high-impact activities. Safety  Do not use the injured limb to support your body weight until your health care provider says that you can. Use crutches as told by your health care provider. General instructions  Do not use any products that contain nicotine or tobacco, such as cigarettes and e-cigarettes. These can delay bone healing. If you need help quitting, ask your health care provider.  To prevent or treat constipation while you are taking prescription pain medicine, your health care provider may recommend that you: ? Drink enough fluid to keep your urine clear or pale yellow. ? Take over-the-counter or prescription medicines. ? Eat foods that are high in fiber, such as fresh fruits and vegetables, whole grains, and beans. ? Limit foods that are high in fat and processed sugars, such as fried and sweet foods.  Take over-the-counter and prescription medicines only as told by your health care provider.  Keep all follow-up visits as told by your health care provider. This is important. Contact a health care provider if:  You have more redness, swelling, or pain around an incision.  You have more fluid or blood coming from an incision.  Your incision feels warm to the touch.  You have a  fever.  You have pain or swelling in your knee, and it gets worse.  You have pain that does not get better with medicine. Get help right away if:  You have trouble breathing.  You have pus or a bad smell coming from an incision.  You have numbness and tingling near the knee joint. Summary  After the procedure, it is common to have knee pain with bruising and swelling on your knee, calf, and ankle.  Icing your knee and raising your leg above the level of your heart will help control the pain and the swelling.  Do physical therapy exercises as told by your health care provider. Physical therapy will help you regain strength and motion in your knee. This information is not intended  to replace advice given to you by your health care provider. Make sure you discuss any questions you have with your health care provider. Document Revised: 07/16/2017 Document Reviewed: 07/28/2016 Elsevier Patient Education  2020 [employer]  Spinal Anesthesia and Epidural Anesthesia, Care After This sheet gives you information about how to care for yourself after your procedure. Your doctor may also give you more specific instructions. If you have problems or questions, call your doctor. Follow these instructions at home: For at least 24 hours after the procedure:   Have a responsible adult stay with you. It is important to have someone help care for you until you are awake and alert.  Rest as needed.  Do not do activities where you could fall or get hurt (injured).  Do not drive.  Do not use heavy machinery.  Do not drink alcohol.  Do not take sleeping pills or medicines that make you sleepy (drowsy).  Do not make important decisions.  Do not sign legal documents.  Do not take care of children on your own. Eating and drinking  If you throw up (vomit), drink water, juice, or soup when nausea and vomiting stop.  Drink enough fluid to keep your pee (urine) pale yellow.  Make sure you do  not feel like throwing up (nauseous) before you eat solid foods.  Follow the diet that your doctor recommends. General instructions  Return to your normal activities as told by your doctor. Ask your doctor what activities are safe for you.  Take over-the-counter and prescription medicines only as told by your doctor.  If you have sleep apnea, surgery and certain medicines can raise your risk for breathing problems. Follow instructions from your doctor about when to wear your sleep device. Your doctor may tell you to wear your sleep device: ? Anytime you are sleeping, including during daytime naps. ? While taking prescription pain medicines, sleeping pills, or medicines that make you sleepy.  Do not use any products that contain nicotine or tobacco. This includes cigarettes and e-cigarettes. ? If you need help quitting, ask your doctor. ? If you smoke, do not smoke by yourself. Make sure someone is nearby in case you need help.  Keep all follow-up visits as told by your doctor. This is important. Contact a doctor if:  It has been more than one day since your procedure and you feel like throwing up.  It has been more than one day since your procedure and you throw up.  You have a rash. Get help right away if:  You have a fever.  You have a headache that lasts a long time.  You have a very bad headache.  Your vision is blurry.  You see two of a single object (double vision).  You are dizzy or light-headed.  You faint.  Your arms or legs tingle, feel weak, or get numb.  You have trouble breathing.  You cannot pee (urinate). Summary  After the procedure, have a responsible adult stay with you at home until you are fully awake and alert.  Do not do activities that might get you injured. Do not drive, use heavy machinery, drink alcohol, or make important decisions for 24 hours after the procedure.  Take medicines as told by your doctor. Do not use products that contain  nicotine or tobacco.  Get help right away if you have a fever, blurry vision, difficulty breathing or passing urine, or weakness or numbness in arms or legs. This information is not intended  to replace advice given to you by your health care provider. Make sure you discuss any questions you have with your health care provider. Document Revised: 07/16/2017 Document Reviewed: 11/25/2015 Elsevier Patient Education  2020 [employer]  General Anesthesia, Adult, Care After This sheet gives you information about how to care for yourself after your procedure. Your health care provider may also give you more specific instructions. If you have problems or questions, contact your health care provider. What can I expect after the procedure? After the procedure, the following side effects are common:  Pain or discomfort at the IV site.  Nausea.  Vomiting.  Sore throat.  Trouble concentrating.  Feeling cold or chills.  Weak or tired.  Sleepiness and fatigue.  Soreness and body aches. These side effects can affect parts of the body that were not involved in surgery. Follow these instructions at home:  For at least 24 hours after the procedure:  Have a responsible adult stay with you. It is important to have someone help care for you until you are awake and alert.  Rest as needed.  Do not: ? Participate in activities in which you could fall or become injured. ? Drive. ? Use heavy machinery. ? Drink alcohol. ? Take sleeping pills or medicines that cause drowsiness. ? Make important decisions or sign legal documents. ? Take care of children on your own. Eating and drinking  Follow any instructions from your health care provider about eating or drinking restrictions.  When you feel hungry, start by eating small amounts of foods that are soft and easy to digest (bland), such as toast. Gradually return to your regular diet.  Drink enough fluid to keep your urine pale yellow.  If  you vomit, rehydrate by drinking water, juice, or clear broth. General instructions  If you have sleep apnea, surgery and certain medicines can increase your risk for breathing problems. Follow instructions from your health care provider about wearing your sleep device: ? Anytime you are sleeping, including during daytime naps. ? While taking prescription pain medicines, sleeping medicines, or medicines that make you drowsy.  Return to your normal activities as told by your health care provider. Ask your health care provider what activities are safe for you.  Take over-the-counter and prescription medicines only as told by your health care provider.  If you smoke, do not smoke without supervision.  Keep all follow-up visits as told by your health care provider. This is important. Contact a health care provider if:  You have nausea or vomiting that does not get better with medicine.  You cannot eat or drink without vomiting.  You have pain that does not get better with medicine.  You are unable to pass urine.  You develop a skin rash.  You have a fever.  You have redness around your IV site that gets worse. Get help right away if:  You have difficulty breathing.  You have chest pain.  You have blood in your urine or stool, or you vomit blood. Summary  After the procedure, it is common to have a sore throat or nausea. It is also common to feel tired.  Have a responsible adult stay with you for the first 24 hours after general anesthesia. It is important to have someone help care for you until you are awake and alert.  When you feel hungry, start by eating small amounts of foods that are soft and easy to digest (bland), such as toast. Gradually return to your regular diet.  Drink enough fluid to keep your urine pale yellow.  Return to your normal activities as told by your health care provider. Ask your health care provider what activities are safe for you. This  information is not intended to replace advice given to you by your health care provider. Make sure you discuss any questions you have with your health care provider. Document Revised: 08/06/2017 Document Reviewed: 03/19/2017 Elsevier Patient Education  2020 [employer]. How to Use Chlorhexidine for Bathing Chlorhexidine gluconate (CHG) is a germ-killing (antiseptic) solution that is used to clean the skin. It can get rid of the bacteria that normally live on the skin and can keep them away for about 24 hours. To clean your skin with CHG, you may be given:  A CHG solution to use in the shower or as part of a sponge bath.  A prepackaged cloth that contains CHG. Cleaning your skin with CHG may help lower the risk for infection:  While you are staying in the intensive care unit of the hospital.  If you have a vascular access, such as a central line, to provide short-term or long-term access to your veins.  If you have a catheter to drain urine from your bladder.  If you are on a ventilator. A ventilator is a machine that helps you breathe by moving air in and out of your lungs.  After surgery. What are the risks? Risks of using CHG include:  A skin reaction.  Hearing loss, if CHG gets in your ears.  Eye injury, if CHG gets in your eyes and is not rinsed out.  The CHG product catching fire. Make sure that you avoid smoking and flames after applying CHG to your skin. Do not use CHG:  If you have a chlorhexidine allergy or have previously reacted to chlorhexidine.  On babies younger than 75 months of age. How to use CHG solution  Use CHG only as told by your health care provider, and follow the instructions on the label.  Use the full amount of CHG as directed. Usually, this is one bottle. During a shower Follow these steps when using CHG solution during a shower (unless your health care provider gives you different instructions): 1. Start the shower. 2. Use your normal soap and  shampoo to wash your face and hair. 3. Turn off the shower or move out of the shower stream. 4. Pour the CHG onto a clean washcloth. Do not use any type of brush or rough-edged sponge. 5. Starting at your neck, lather your body down to your toes. Make sure you follow these instructions: ? If you will be having surgery, pay special attention to the part of your body where you will be having surgery. Scrub this area for at least 1 minute. ? Do not use CHG on your head or face. If the solution gets into your ears or eyes, rinse them well with water. ? Avoid your genital area. ? Avoid any areas of skin that have broken skin, cuts, or scrapes. ? Scrub your back and under your arms. Make sure to wash skin folds. 6. Let the lather sit on your skin for 1-2 minutes or as long as told by your health care provider. 7. Thoroughly rinse your entire body in the shower. Make sure that all body creases and crevices are rinsed well. 8. Dry off with a clean towel. Do not put any substances on your body afterward--such as powder, lotion, or perfume--unless you are told to do so by  your health care provider. Only use lotions that are recommended by the manufacturer. 9. Put on clean clothes or pajamas. 10. If it is the night before your surgery, sleep in clean sheets.  During a sponge bath Follow these steps when using CHG solution during a sponge bath (unless your health care provider gives you different instructions): 1. Use your normal soap and shampoo to wash your face and hair. 2. Pour the CHG onto a clean washcloth. 3. Starting at your neck, lather your body down to your toes. Make sure you follow these instructions: ? If you will be having surgery, pay special attention to the part of your body where you will be having surgery. Scrub this area for at least 1 minute. ? Do not use CHG on your head or face. If the solution gets into your ears or eyes, rinse them well with water. ? Avoid your genital  area. ? Avoid any areas of skin that have broken skin, cuts, or scrapes. ? Scrub your back and under your arms. Make sure to wash skin folds. 4. Let the lather sit on your skin for 1-2 minutes or as long as told by your health care provider. 5. Using a different clean, wet washcloth, thoroughly rinse your entire body. Make sure that all body creases and crevices are rinsed well. 6. Dry off with a clean towel. Do not put any substances on your body afterward--such as powder, lotion, or perfume--unless you are told to do so by your health care provider. Only use lotions that are recommended by the manufacturer. 7. Put on clean clothes or pajamas. 8. If it is the night before your surgery, sleep in clean sheets. How to use CHG prepackaged cloths  Only use CHG cloths as told by your health care provider, and follow the instructions on the label.  Use the CHG cloth on clean, dry skin.  Do not use the CHG cloth on your head or face unless your health care provider tells you to.  When washing with the CHG cloth: ? Avoid your genital area. ? Avoid any areas of skin that have broken skin, cuts, or scrapes. Before surgery Follow these steps when using a CHG cloth to clean before surgery (unless your health care provider gives you different instructions): 1. Using the CHG cloth, vigorously scrub the part of your body where you will be having surgery. Scrub using a back-and-forth motion for 3 minutes. The area on your body should be completely wet with CHG when you are done scrubbing. 2. Do not rinse. Discard the cloth and let the area air-dry. Do not put any substances on the area afterward, such as powder, lotion, or perfume. 3. Put on clean clothes or pajamas. 4. If it is the night before your surgery, sleep in clean sheets.  For general bathing Follow these steps when using CHG cloths for general bathing (unless your health care provider gives you different instructions). 1. Use a separate CHG  cloth for each area of your body. Make sure you wash between any folds of skin and between your fingers and toes. Wash your body in the following order, switching to a new cloth after each step: ? The front of your neck, shoulders, and chest. ? Both of your arms, under your arms, and your hands. ? Your stomach and groin area, avoiding the genitals. ? Your right leg and foot. ? Your left leg and foot. ? The back of your neck, your back, and your buttocks. 2. Do  not rinse. Discard the cloth and let the area air-dry. Do not put any substances on your body afterward--such as powder, lotion, or perfume--unless you are told to do so by your health care provider. Only use lotions that are recommended by the manufacturer. 3. Put on clean clothes or pajamas. Contact a health care provider if:  Your skin gets irritated after scrubbing.  You have questions about using your solution or cloth. Get help right away if:  Your eyes become very red or swollen.  Your eyes itch badly.  Your skin itches badly and is red or swollen.  Your hearing changes.  You have trouble seeing.  You have swelling or tingling in your mouth or throat.  You have trouble breathing.  You swallow any chlorhexidine. Summary  Chlorhexidine gluconate (CHG) is a germ-killing (antiseptic) solution that is used to clean the skin. Cleaning your skin with CHG may help to lower your risk for infection.  You may be given CHG to use for bathing. It may be in a bottle or in a prepackaged cloth to use on your skin. Carefully follow your health care provider's instructions and the instructions on the product label.  Do not use CHG if you have a chlorhexidine allergy.  Contact your health care provider if your skin gets irritated after scrubbing. This information is not intended to replace advice given to you by your health care provider. Make sure you discuss any questions you have with your health care provider. Document Revised:  10/20/2018 Document Reviewed: 07/01/2017 Elsevier Patient Education  [place].

## 2020-02-07 ENCOUNTER — Telehealth: Payer: Self-pay | Admitting: Radiology

## 2020-02-07 NOTE — Telephone Encounter (Signed)
[id number] is pending authorization started today.

## 2020-02-09 ENCOUNTER — Encounter (HOSPITAL_COMMUNITY)
Admission: RE | Admit: 2020-02-09 | Discharge: 2020-02-09 | Disposition: A | Discharge status: Home / Self Care | Payer: 59 | Source: Ambulatory Visit | Attending: Orthopedic Surgery | Admitting: Orthopedic Surgery

## 2020-02-09 ENCOUNTER — Other Ambulatory Visit (HOSPITAL_COMMUNITY)
Admission: RE | Admit: 2020-02-09 | Discharge: 2020-02-09 | Disposition: A | Discharge status: Home / Self Care | Payer: 59 | Source: Ambulatory Visit | Attending: Orthopedic Surgery | Admitting: Orthopedic Surgery

## 2020-02-09 ENCOUNTER — Encounter (HOSPITAL_COMMUNITY): Payer: Self-pay

## 2020-02-09 ENCOUNTER — Other Ambulatory Visit: Payer: Self-pay

## 2020-02-09 DIAGNOSIS — Z01812 Encounter for preprocedural laboratory examination: Secondary | ICD-10-CM | POA: Insufficient documentation

## 2020-02-09 DIAGNOSIS — Z20822 Contact with and (suspected) exposure to covid-19: Secondary | ICD-10-CM | POA: Insufficient documentation

## 2020-02-09 LAB — CBC WITH DIFFERENTIAL/PLATELET
Abs Immature Granulocytes: 0.03 10*3/uL (ref 0.00–0.07)
Basophils Absolute: 0.1 10*3/uL (ref 0.0–0.1)
Basophils Relative: 1 %
Eosinophils Absolute: 0.5 10*3/uL (ref 0.0–0.5)
Eosinophils Relative: 5 %
HCT: 42.5 % (ref 39.0–52.0)
Hemoglobin: 14.7 g/dL (ref 13.0–17.0)
Immature Granulocytes: 0 %
Lymphocytes Relative: 28 %
Lymphs Abs: 3 10*3/uL (ref 0.7–4.0)
MCH: 30.1 pg (ref 26.0–34.0)
MCHC: 34.6 g/dL (ref 30.0–36.0)
MCV: 86.9 fL (ref 80.0–100.0)
Monocytes Absolute: 0.7 10*3/uL (ref 0.1–1.0)
Monocytes Relative: 7 %
Neutro Abs: 6.3 10*3/uL (ref 1.7–7.7)
Neutrophils Relative %: 59 %
Platelets: 257 10*3/uL (ref 150–400)
RBC: 4.89 MIL/uL (ref 4.22–5.81)
RDW: 13.4 % (ref 11.5–15.5)
WBC: 10.6 10*3/uL — ABNORMAL HIGH (ref 4.0–10.5)
nRBC: 0 % (ref 0.0–0.2)

## 2020-02-09 LAB — HEMOGLOBIN A1C
Hgb A1c MFr Bld: 11.9 % — ABNORMAL HIGH (ref 4.8–5.6)
Mean Plasma Glucose: 294.83 mg/dL

## 2020-02-09 LAB — BASIC METABOLIC PANEL
Anion gap: 10 (ref 5–15)
BUN: 16 mg/dL (ref 8–23)
CO2: 25 mmol/L (ref 22–32)
Calcium: 9.3 mg/dL (ref 8.9–10.3)
Chloride: 99 mmol/L (ref 98–111)
Creatinine, Ser: 0.84 mg/dL (ref 0.61–1.24)
GFR calc Af Amer: >60 mL/min (ref >60)
GFR calc non Af Amer: >60 mL/min (ref >60)
Glucose, Bld: 416 mg/dL — ABNORMAL HIGH (ref 70–99)
Potassium: 4.5 mmol/L (ref 3.5–5.1)
Sodium: 134 mmol/L — ABNORMAL LOW (ref 135–145)

## 2020-02-10 LAB — SARS CORONAVIRUS 2 (TAT 6-24 HRS): SARS Coronavirus 2: NEGATIVE

## 2020-02-12 NOTE — H&P (Signed)
  Chief Complaint  Patient presents with  . Knee Pain      right / review MRI     64 year old male diabetic chronic right knee pain catching locking feels like the knee is coming out of place presents back for MRI follow-up   He was treated nonoperatively with bracing anti-inflammatories and did not improve   His MRI shows osteoarthritis of his patella and a torn lateral meniscus   We discussed this at length including preop consultation and risk-benefit ratio   Patient agrees to arthroscopy right knee and partial lateral meniscectomy   Out of work 4 weeks       Past Medical History:  Diagnosis Date  . Diabetes mellitus without complication (HCC)    . Hypercholesteremia      History reviewed. No pertinent surgical history.      Family History  Problem Relation Age of Onset  . Diabetes Mother    . Hypertension Mother    . Diabetes Father      Social History        Tobacco Use  . Smoking status: Never Smoker  . Smokeless tobacco: Never Used  Vaping Use  . Vaping Use: Never used  Substance Use Topics  . Alcohol use: Not Currently  . Drug use: Never    BP 139/80   Pulse 70   Ht 5\' 8"  (1.727 m)   Wt 194 lb (88 kg)   BMI 29.50 kg/m     Physical Exam Constitutional:      General: He is not in acute distress.    Appearance: He is well-developed.  Cardiovascular:     Comments: No peripheral edema Skin:    General: Skin is warm and dry.  Neurological:     Mental Status: He is alert and oriented to person, place, and time.     Sensory: No sensory deficit.     Coordination: Coordination normal.     Gait: Gait normal.     Deep Tendon Reflexes: Reflexes are normal and symmetric.    Normal upper extremities  Right lower extremity tenderness in the lateral compartment crepitance on range of motion including patellofemoral joint with some pain knee is stable otherwise.  Meniscal signs are equivocal but seem positive laterally for lateral meniscal tear he has  decreased range of motion but a stable knee there is a small joint effusion  Sensation is normal skin is intact without abnormality there is no peripheral edema color capillary refill normal      Encounter Diagnoses  Name Primary?  . Primary localized osteoarthritis of knee Yes  . Chronic pain of right knee    . Meniscus, lateral, derangement, right        Arthroscopy right knee lateral meniscectomy

## 2020-02-13 ENCOUNTER — Encounter (HOSPITAL_COMMUNITY)
Admission: RE | Disposition: A | Discharge status: Home / Self Care | Payer: Self-pay | Source: Home / Self Care | Attending: Orthopedic Surgery

## 2020-02-13 ENCOUNTER — Encounter (HOSPITAL_COMMUNITY): Payer: Self-pay | Admitting: Orthopedic Surgery

## 2020-02-13 ENCOUNTER — Encounter: Payer: Self-pay | Admitting: Orthopedic Surgery

## 2020-02-13 ENCOUNTER — Ambulatory Visit (HOSPITAL_COMMUNITY): Payer: 59 | Admitting: Anesthesiology

## 2020-02-13 ENCOUNTER — Ambulatory Visit (HOSPITAL_COMMUNITY)
Admission: RE | Admit: 2020-02-13 | Discharge: 2020-02-13 | Disposition: A | Discharge status: Home / Self Care | Payer: 59 | Attending: Orthopedic Surgery | Admitting: Orthopedic Surgery

## 2020-02-13 ENCOUNTER — Other Ambulatory Visit: Payer: Self-pay

## 2020-02-13 DIAGNOSIS — Z7984 Long term (current) use of oral hypoglycemic drugs: Secondary | ICD-10-CM | POA: Diagnosis not present

## 2020-02-13 DIAGNOSIS — E1165 Type 2 diabetes mellitus with hyperglycemia: Secondary | ICD-10-CM | POA: Insufficient documentation

## 2020-02-13 DIAGNOSIS — M23300 Other meniscus derangements, unspecified lateral meniscus, right knee: Secondary | ICD-10-CM | POA: Diagnosis not present

## 2020-02-13 DIAGNOSIS — X58XXXA Exposure to other specified factors, initial encounter: Secondary | ICD-10-CM | POA: Diagnosis not present

## 2020-02-13 DIAGNOSIS — M1711 Unilateral primary osteoarthritis, right knee: Secondary | ICD-10-CM | POA: Diagnosis not present

## 2020-02-13 DIAGNOSIS — S83281A Other tear of lateral meniscus, current injury, right knee, initial encounter: Secondary | ICD-10-CM | POA: Diagnosis not present

## 2020-02-13 HISTORY — PX: KNEE ARTHROSCOPY WITH LATERAL MENISECTOMY: SHX6193

## 2020-02-13 LAB — GLUCOSE, CAPILLARY
Glucose-Capillary: 397 mg/dL — ABNORMAL HIGH (ref 70–99)
Glucose-Capillary: 354 mg/dL — ABNORMAL HIGH (ref 70–99)
Glucose-Capillary: 335 mg/dL — ABNORMAL HIGH (ref 70–99)
Glucose-Capillary: 284 mg/dL — ABNORMAL HIGH (ref 70–99)

## 2020-02-13 SURGERY — ARTHROSCOPY, KNEE, WITH LATERAL MENISCECTOMY
Anesthesia: General | Site: Knee | Laterality: Right

## 2020-02-13 MED ORDER — LIDOCAINE 2% (20 MG/ML) 5 ML SYRINGE
INTRAMUSCULAR | Status: DC | PRN
  Administered 2020-02-13: 60 mg via INTRAVENOUS

## 2020-02-13 MED ORDER — SODIUM CHLORIDE 0.9 % IR SOLN
Status: DC | PRN
  Administered 2020-02-13 (×3): 3000 mL

## 2020-02-13 MED ORDER — INSULIN ASPART 100 UNIT/ML ~~LOC~~ SOLN
0.0000 [IU] | Freq: Three times a day (TID) | SUBCUTANEOUS | Status: DC
  Administered 2020-02-13: 15 [IU] via SUBCUTANEOUS
  Filled 2020-02-13: qty 0.15

## 2020-02-13 MED ORDER — FENTANYL CITRATE (PF) 100 MCG/2ML IJ SOLN
INTRAMUSCULAR | Status: DC | PRN
  Administered 2020-02-13: 50 ug via INTRAVENOUS
  Administered 2020-02-13 (×2): 25 ug via INTRAVENOUS
  Administered 2020-02-13: 50 ug via INTRAVENOUS
  Administered 2020-02-13: 25 ug via INTRAVENOUS

## 2020-02-13 MED ORDER — MIDAZOLAM HCL 2 MG/2ML IJ SOLN
INTRAMUSCULAR | Status: AC
  Filled 2020-02-13: qty 2

## 2020-02-13 MED ORDER — SUCCINYLCHOLINE CHLORIDE 200 MG/10ML IV SOSY
PREFILLED_SYRINGE | INTRAVENOUS | Status: DC | PRN
  Administered 2020-02-13: 120 mg via INTRAVENOUS

## 2020-02-13 MED ORDER — CEFAZOLIN SODIUM-DEXTROSE 2-4 GM/100ML-% IV SOLN
2.0000 g | INTRAVENOUS | Status: AC
  Administered 2020-02-13: 2 g via INTRAVENOUS
  Filled 2020-02-13: qty 100

## 2020-02-13 MED ORDER — ONDANSETRON HCL 4 MG/2ML IJ SOLN
INTRAMUSCULAR | Status: AC
  Filled 2020-02-13: qty 2

## 2020-02-13 MED ORDER — CHLORHEXIDINE GLUCONATE 0.12 % MT SOLN
15.0000 mL | Freq: Once | OROMUCOSAL | Status: AC
  Administered 2020-02-13: 15 mL via OROMUCOSAL

## 2020-02-13 MED ORDER — PHENYLEPHRINE HCL (PRESSORS) 10 MG/ML IV SOLN
INTRAVENOUS | Status: AC
  Filled 2020-02-13: qty 2

## 2020-02-13 MED ORDER — HYDROCODONE-ACETAMINOPHEN 7.5-325 MG PO TABS: 1.0000 | ORAL_TABLET | ORAL | 0 refills | Status: DC | PRN

## 2020-02-13 MED ORDER — ORAL CARE MOUTH RINSE: 15.0000 mL | Freq: Once | OROMUCOSAL | Status: AC

## 2020-02-13 MED ORDER — PROPOFOL 10 MG/ML IV BOLUS
INTRAVENOUS | Status: AC
  Filled 2020-02-13: qty 20

## 2020-02-13 MED ORDER — INSULIN ASPART 100 UNIT/ML ~~LOC~~ SOLN
15.0000 [IU] | Freq: Once | SUBCUTANEOUS | Status: AC
  Administered 2020-02-13: 15 [IU] via SUBCUTANEOUS
  Filled 2020-02-13: qty 0.15

## 2020-02-13 MED ORDER — ONDANSETRON HCL 4 MG/2ML IJ SOLN
INTRAMUSCULAR | Status: DC | PRN
  Administered 2020-02-13: 4 mg via INTRAVENOUS

## 2020-02-13 MED ORDER — HYDROCODONE-ACETAMINOPHEN 7.5-325 MG PO TABS
1.0000 | ORAL_TABLET | Freq: Once | ORAL | Status: AC
  Administered 2020-02-13: 1 via ORAL
  Filled 2020-02-13: qty 1

## 2020-02-13 MED ORDER — FENTANYL CITRATE (PF) 250 MCG/5ML IJ SOLN
INTRAMUSCULAR | Status: AC
  Filled 2020-02-13: qty 5

## 2020-02-13 MED ORDER — ONDANSETRON HCL 4 MG/2ML IJ SOLN
4.0000 mg | Freq: Once | INTRAMUSCULAR | Status: AC
  Administered 2020-02-13: 4 mg via INTRAVENOUS

## 2020-02-13 MED ORDER — PROPOFOL 10 MG/ML IV BOLUS
INTRAVENOUS | Status: DC | PRN
  Administered 2020-02-13: 160 mg via INTRAVENOUS

## 2020-02-13 MED ORDER — EPHEDRINE 5 MG/ML INJ
INTRAVENOUS | Status: AC
  Filled 2020-02-13: qty 10

## 2020-02-13 MED ORDER — BUPIVACAINE-EPINEPHRINE (PF) 0.5% -1:200000 IJ SOLN
INTRAMUSCULAR | Status: AC
  Filled 2020-02-13: qty 60

## 2020-02-13 MED ORDER — EPINEPHRINE PF 1 MG/ML IJ SOLN
INTRAMUSCULAR | Status: AC
  Filled 2020-02-13: qty 5

## 2020-02-13 MED ORDER — 0.9 % SODIUM CHLORIDE (POUR BTL) OPTIME
TOPICAL | Status: DC | PRN
  Administered 2020-02-13: 1000 mL

## 2020-02-13 MED ORDER — MIDAZOLAM HCL 5 MG/5ML IJ SOLN
INTRAMUSCULAR | Status: DC | PRN
  Administered 2020-02-13: 2 mg via INTRAVENOUS

## 2020-02-13 MED ORDER — LACTATED RINGERS IV SOLN: INTRAVENOUS | Status: DC

## 2020-02-13 MED ORDER — IBUPROFEN 400 MG PO TABS
400.0000 mg | ORAL_TABLET | Freq: Once | ORAL | Status: AC
  Administered 2020-02-13: 400 mg via ORAL
  Filled 2020-02-13: qty 1

## 2020-02-13 MED ORDER — ONDANSETRON HCL 4 MG/2ML IJ SOLN
4.0000 mg | Freq: Once | INTRAMUSCULAR | Status: AC
  Administered 2020-02-13: 4 mg via INTRAVENOUS
  Filled 2020-02-13: qty 2

## 2020-02-13 MED ORDER — EPHEDRINE SULFATE-NACL 50-0.9 MG/10ML-% IV SOSY
PREFILLED_SYRINGE | INTRAVENOUS | Status: DC | PRN
  Administered 2020-02-13: 10 mg via INTRAVENOUS

## 2020-02-13 MED ORDER — BUPIVACAINE-EPINEPHRINE (PF) 0.5% -1:200000 IJ SOLN
INTRAMUSCULAR | Status: DC | PRN
  Administered 2020-02-13: 60 mL via PERINEURAL

## 2020-02-13 MED ORDER — CHLORHEXIDINE GLUCONATE 0.12 % MT SOLN
OROMUCOSAL | Status: AC
  Filled 2020-02-13: qty 15

## 2020-02-13 SURGICAL SUPPLY — 44 items
ABLATOR ASPIRATE 50D MULTI-PRT (SURGICAL WAND) ×2 IMPLANT
BANDAGE ELASTIC 6 VELCRO NS (GAUZE/BANDAGES/DRESSINGS) ×2 IMPLANT
BLADE EXCALIBUR 4.0X13 (MISCELLANEOUS) ×4 IMPLANT
BLADE SURG SZ11 CARB STEEL (BLADE) ×2 IMPLANT
BNDG ELASTIC 6X5.8 VLCR NS LF (GAUZE/BANDAGES/DRESSINGS) ×2 IMPLANT
CHLORAPREP W/TINT 26 (MISCELLANEOUS) ×4 IMPLANT
CLOTH BEACON ORANGE TIMEOUT ST (SAFETY) ×2 IMPLANT
COOLER ICEMAN CLASSIC (MISCELLANEOUS) ×2 IMPLANT
COVER WAND RF STERILE (DRAPES) ×2 IMPLANT
DECANTER SPIKE VIAL GLASS SM (MISCELLANEOUS) ×4 IMPLANT
DRAPE HALF SHEET 40X57 (DRAPES) ×2 IMPLANT
GAUZE 4X4 16PLY RFD (DISPOSABLE) ×2 IMPLANT
GAUZE SPONGE 4X4 12PLY STRL (GAUZE/BANDAGES/DRESSINGS) ×2 IMPLANT
GAUZE XEROFORM 5X9 LF (GAUZE/BANDAGES/DRESSINGS) ×2 IMPLANT
GLOVE BIO SURGEON STRL SZ7 (GLOVE) ×2 IMPLANT
GLOVE BIOGEL PI IND STRL 7.0 (GLOVE) ×1 IMPLANT
GLOVE BIOGEL PI INDICATOR 7.0 (GLOVE) ×1
GLOVE ECLIPSE 7.0 STRL STRAW (GLOVE) ×2 IMPLANT
GLOVE SKINSENSE NS SZ8.0 LF (GLOVE) ×1
GLOVE SKINSENSE STRL SZ8.0 LF (GLOVE) ×1 IMPLANT
GLOVE SS N UNI LF 8.5 STRL (GLOVE) ×2 IMPLANT
GOWN STRL REUS W/TWL LRG LVL3 (GOWN DISPOSABLE) ×2 IMPLANT
GOWN STRL REUS W/TWL XL LVL3 (GOWN DISPOSABLE) ×2 IMPLANT
HLDR LEG FOAM (MISCELLANEOUS) ×1 IMPLANT
IV NS IRRIG 3000ML ARTHROMATIC (IV SOLUTION) ×6 IMPLANT
KIT BLADEGUARD II DBL (SET/KITS/TRAYS/PACK) ×2 IMPLANT
KIT TURNOVER CYSTO (KITS) ×2 IMPLANT
LEG HOLDER FOAM (MISCELLANEOUS) ×2
MANIFOLD NEPTUNE II (INSTRUMENTS) ×2 IMPLANT
MARKER SKIN DUAL TIP RULER LAB (MISCELLANEOUS) ×2 IMPLANT
NEEDLE HYPO 18GX1.5 BLUNT FILL (NEEDLE) ×2 IMPLANT
NEEDLE HYPO 21X1.5 SAFETY (NEEDLE) ×2 IMPLANT
NEEDLE SPNL 18GX3.5 QUINCKE PK (NEEDLE) ×2 IMPLANT
NS IRRIG 1000ML POUR BTL (IV SOLUTION) ×2 IMPLANT
PACK ARTHROSCOPY WL (CUSTOM PROCEDURE TRAY) ×2 IMPLANT
PAD ABD 5X9 TENDERSORB (GAUZE/BANDAGES/DRESSINGS) ×2 IMPLANT
PAD ARMBOARD 7.5X6 YLW CONV (MISCELLANEOUS) ×2 IMPLANT
PAD COLD SHLDR WRAP-ON (PAD) ×2 IMPLANT
PADDING CAST COTTON 6X4 STRL (CAST SUPPLIES) ×2 IMPLANT
PORT APPOLLO RF 90DEGREE MULTI (SURGICAL WAND) IMPLANT
SET BASIN LINEN APH (SET/KITS/TRAYS/PACK) ×2 IMPLANT
SUT ETHILON 3 0 FSL (SUTURE) ×2 IMPLANT
SYR 30ML LL (SYRINGE) ×2 IMPLANT
TUBE CONNECTING 12X1/4 (SUCTIONS) ×4 IMPLANT

## 2020-02-13 NOTE — Anesthesia Preprocedure Evaluation (Signed)
Anesthesia Evaluation  Patient identified by MRN, date of birth, ID band Patient awake    Reviewed: Allergy & Precautions, H&P , NPO status , Patient's Chart, lab work & pertinent test results, reviewed documented beta blocker date and time   Airway Mallampati: II  TM Distance: >3 FB Neck ROM: full    Dental no notable dental hx.    Pulmonary neg pulmonary ROS,    Pulmonary exam normal breath sounds clear to auscultation       Cardiovascular Exercise Tolerance: Good negative cardio ROS   Rhythm:regular Rate:Normal     Neuro/Psych negative neurological ROS  negative psych ROS   GI/Hepatic negative GI ROS, Neg liver ROS,   Endo/Other  negative endocrine ROSdiabetes, Poorly Controlled, Type 2, Oral Hypoglycemic Agents  Renal/GU negative Renal ROS  negative genitourinary   Musculoskeletal   Abdominal   Peds  Hematology negative hematology ROS (+)   Anesthesia Other Findings   Reproductive/Obstetrics negative OB ROS                             Anesthesia Physical Anesthesia Plan  ASA: III  Anesthesia Plan: General   Post-op Pain Management:    Induction:   PONV Risk Score and Plan: 2 and Ondansetron  Airway Management Planned:   Additional Equipment:   Intra-op Plan:   Post-operative Plan:   Informed Consent: I have reviewed the patients History and Physical, chart, labs and discussed the procedure including the risks, benefits and alternatives for the proposed anesthesia with the patient or authorized representative who has indicated his/her understanding and acceptance.     Dental Advisory Given  Plan Discussed with: CRNA  Anesthesia Plan Comments:         Anesthesia Quick Evaluation

## 2020-02-13 NOTE — Op Note (Signed)
02/13/2020  11:47 AM  PATIENT:  Stanley Long  64 y.o. male  PRE-OPERATIVE DIAGNOSIS:  Torn lateral meniscus right knee  POST-OPERATIVE DIAGNOSIS:  Torn lateral meniscus right knee  PROCEDURE:  Procedure(s): KNEE ARTHROSCOPY WITH LATERAL MENISCECTOMY Right (Right)  SURGEON:  Surgeon(s) and Role:    Vickki Hearing, MD - Primary  Surgical findings torn lateral meniscus the entire body and posterior horn portion of the anterior horn, ACL PCL intact, lateral meniscus intact, lateral cartilage intact,  Chondral lesion lateral tibial plateau grade 3, lateral femoral condyle chondral lesion grade 3, trochlea grade 3 lesion, patella grade 2 lesion    02/13/2020  11:49 AM  Knee arthroscopy dictation   The patient was identified in the preoperative holding area using 2 approved identification mechanisms. The chart was reviewed and updated. The surgical site was confirmed as right knee and marked with an indelible marker.  The patient was taken to the operating room for anesthesia. After successful general anesthesia, 2 g Ancef was used as IV antibiotics.  The patient was placed in the supine position with the (right) the operative extremity in an arthroscopic leg holder and the opposite extremity in a padded leg holder.  The timeout was executed.  A lateral portal was established with an 11 blade and the scope was introduced into the joint. A diagnostic arthroscopy was performed in circumferential manner examining the entire knee joint. A medial portal was established and the diagnostic arthroscopy was repeated using a probe to palpate intra-articular structures as they were encountered.    The lateral meniscus was resected using a duckbill forceps. The meniscal fragments were removed with a motorized shaver. The meniscus was balanced with a combination of a motorized shaver and a 50 ArthroCare wand until a stable rim was obtained.  The arthroscopic pump was placed on the wash  mode and any excess debris was removed from the joint using suction.  60 cc of Marcaine with epinephrine was injected through the arthroscope.  The portals were closed with 3-0 nylon suture.  A sterile bandage, Ace wrap and Cryo/Cuff was placed and the Cryo/Cuff was activated. The patient was taken to the recovery room in stable condition.  PHYSICIAN ASSISTANT: no  ASSISTANTS: none   ANESTHESIA:   General  EBL:  none   BLOOD ADMINISTERED:none  DRAINS: none   LOCAL MEDICATIONS USED:  MARCAINE     SPECIMEN:  No Specimen  DISPOSITION OF SPECIMEN:  N/A  COUNTS:  YES   DICTATION: .Dragon Dictation  PLAN OF CARE: Discharge to home after PACU  PATIENT DISPOSITION:  PACU - hemodynamically stable.   Delay start of Pharmacological VTE agent (>24hrs) due to surgical blood loss or risk of bleeding: not applicable

## 2020-02-13 NOTE — Interval H&P Note (Signed)
History and Physical Interval Note:  02/13/2020 10:33 AM  Stanley Long  has presented today for surgery, with the diagnosis of Torn lateral meniscus right knee.  The various methods of treatment have been discussed with the patient and family. After consideration of risks, benefits and other options for treatment, the patient has consented to  Procedure(s): KNEE ARTHROSCOPY WITH LATERAL MENISCECTOMY Right (Right) as a surgical intervention.  The patient's history has been reviewed, patient examined, no change in status, stable for surgery.  I have reviewed the patient's chart and labs.  Questions were answered to the patient's satisfaction.     Fuller Canada

## 2020-02-13 NOTE — Progress Notes (Signed)
Pt had emesis x 2 in the post-op once in recovery, was given Zofran 4 mg IV in post-op

## 2020-02-13 NOTE — Anesthesia Procedure Notes (Signed)
Procedure Name: Intubation Date/Time: 02/13/2020 10:47 AM Performed by: Marny Lowenstein, CRNA Pre-anesthesia Checklist: Patient identified, Emergency Drugs available, Suction available and Patient being monitored Patient Re-evaluated:Patient Re-evaluated prior to induction Oxygen Delivery Method: Circle system utilized Preoxygenation: Pre-oxygenation with 100% oxygen Induction Type: IV induction and Cricoid Pressure applied Laryngoscope Size: Miller and 2 Grade View: Grade I Tube type: Oral Tube size: 7.0 mm Number of attempts: 1 Airway Equipment and Method: Patient positioned with wedge pillow and Stylet Placement Confirmation: ETT inserted through vocal cords under direct vision,  positive ETCO2 and breath sounds checked- equal and bilateral Secured at: 22 cm Tube secured with: Tape Dental Injury: Teeth and Oropharynx as per pre-operative assessment  Comments: Pt with poor dentition; all teeth intact after intubation

## 2020-02-13 NOTE — Transfer of Care (Signed)
Immediate Anesthesia Transfer of Care Note  Patient: Stanley Long  Procedure(s) Performed: KNEE ARTHROSCOPY WITH LATERAL MENISCECTOMY Right (Right Knee)  Patient Location: PACU  Anesthesia Type:General  Level of Consciousness: drowsy and patient cooperative  Airway & Oxygen Therapy: Patient Spontanous Breathing and Patient connected to nasal cannula oxygen  Post-op Assessment: Report given to RN and Post -op Vital signs reviewed and stable  Post vital signs: Reviewed and stable  Last Vitals:  Vitals Value Taken Time  BP    Temp    Pulse 82 02/13/20 1159  Resp 20 02/13/20 1159  SpO2 95 % 02/13/20 1159  Vitals shown include unvalidated device data.  Last Pain:  Vitals:   02/13/20 0845  TempSrc: Oral  PainSc: 1       Patients Stated Pain Goal: 5 (02/13/20 0845)  Complications: No complications documented.

## 2020-02-13 NOTE — Brief Op Note (Signed)
02/13/2020  11:47 AM  PATIENT:  Stanley Long  63 y.o. male  PRE-OPERATIVE DIAGNOSIS:  Torn lateral meniscus right knee  POST-OPERATIVE DIAGNOSIS:  Torn lateral meniscus right knee  PROCEDURE:  Procedure(s): KNEE ARTHROSCOPY WITH LATERAL MENISCECTOMY Right (Right)  SURGEON:  Surgeon(s) and Role:    * Zaviyar Rahal E, MD - Primary  Surgical findings torn lateral meniscus the entire body and posterior horn portion of the anterior horn, ACL PCL intact, lateral meniscus intact, lateral cartilage intact,  Chondral lesion lateral tibial plateau grade 3, lateral femoral condyle chondral lesion grade 3, trochlea grade 3 lesion, patella grade 2 lesion    02/13/2020  11:49 AM  Knee arthroscopy dictation   The patient was identified in the preoperative holding area using 2 approved identification mechanisms. The chart was reviewed and updated. The surgical site was confirmed as right knee and marked with an indelible marker.  The patient was taken to the operating room for anesthesia. After successful general anesthesia, 2 g Ancef was used as IV antibiotics.  The patient was placed in the supine position with the (right) the operative extremity in an arthroscopic leg holder and the opposite extremity in a padded leg holder.  The timeout was executed.  A lateral portal was established with an 11 blade and the scope was introduced into the joint. A diagnostic arthroscopy was performed in circumferential manner examining the entire knee joint. A medial portal was established and the diagnostic arthroscopy was repeated using a probe to palpate intra-articular structures as they were encountered.    The lateral meniscus was resected using a duckbill forceps. The meniscal fragments were removed with a motorized shaver. The meniscus was balanced with a combination of a motorized shaver and a 50 ArthroCare wand until a stable rim was obtained.  The arthroscopic pump was placed on the wash  mode and any excess debris was removed from the joint using suction.  60 cc of Marcaine with epinephrine was injected through the arthroscope.  The portals were closed with 3-0 nylon suture.  A sterile bandage, Ace wrap and Cryo/Cuff was placed and the Cryo/Cuff was activated. The patient was taken to the recovery room in stable condition.  PHYSICIAN ASSISTANT: no  ASSISTANTS: none   ANESTHESIA:   General  EBL:  none   BLOOD ADMINISTERED:none  DRAINS: none   LOCAL MEDICATIONS USED:  MARCAINE     SPECIMEN:  No Specimen  DISPOSITION OF SPECIMEN:  N/A  COUNTS:  YES   DICTATION: .Dragon Dictation  PLAN OF CARE: Discharge to home after PACU  PATIENT DISPOSITION:  PACU - hemodynamically stable.   Delay start of Pharmacological VTE agent (>24hrs) due to surgical blood loss or risk of bleeding: not applicable  

## 2020-02-13 NOTE — Anesthesia Postprocedure Evaluation (Signed)
Anesthesia Post Note  Patient: Stanley Long  Procedure(s) Performed: KNEE ARTHROSCOPY WITH LATERAL MENISCECTOMY Right (Right Knee)  Patient location during evaluation: PACU Anesthesia Type: General Level of consciousness: awake Pain management: pain level controlled Vital Signs Assessment: post-procedure vital signs reviewed and stable Respiratory status: spontaneous breathing Cardiovascular status: blood pressure returned to baseline Postop Assessment: no headache Anesthetic complications: no   No complications documented.   Last Vitals:  Vitals:   02/13/20 1230 02/13/20 1257  BP: (!) 164/88 130/80  Pulse: 93 71  Resp: (!) 26 18  Temp:  36.6 C  SpO2: 97% 98%    Last Pain:  Vitals:   02/13/20 1257  TempSrc: Oral  PainSc: 1                  Windell Norfolk

## 2020-02-13 NOTE — Progress Notes (Signed)
RN explained and demostrated incentive spirometry teaching. Pt demostrated with teach back and has good knowledge of the use of device.

## 2020-02-14 ENCOUNTER — Encounter (HOSPITAL_COMMUNITY): Payer: Self-pay | Admitting: Orthopedic Surgery

## 2020-02-21 ENCOUNTER — Encounter: Payer: Self-pay | Admitting: Orthopedic Surgery

## 2020-02-21 ENCOUNTER — Other Ambulatory Visit: Payer: Self-pay

## 2020-02-21 ENCOUNTER — Ambulatory Visit (INDEPENDENT_AMBULATORY_CARE_PROVIDER_SITE_OTHER): Payer: 59 | Admitting: Orthopedic Surgery

## 2020-02-21 DIAGNOSIS — Z9889 Other specified postprocedural states: Secondary | ICD-10-CM

## 2020-02-21 NOTE — Progress Notes (Signed)
Chief Complaint  Patient presents with  . Post-op Follow-up    02/13/20 right knee scope     Stanley Long had an arthroscopy of his right knee had a torn lateral meniscus and he had grade 3 changes in his lateral compartment on both sides of the joint and grade 3 trochlear and 2 patellar lesions  He is doing well he can bend his knee past 90 degrees he does not fully extend it but some of that is based on his preop evaluation  He does have some swelling of the joint his suture line was normal he is using a cane  I have asked him to start quadricep strengthening exercises maintain range of motion control swelling with ice and follow-up in 4 weeks  Encounter Diagnosis  Name Primary?  . S/P right knee arthroscopy 02/13/20  Yes

## 2020-03-20 ENCOUNTER — Encounter: Payer: Self-pay | Admitting: Orthopedic Surgery

## 2020-03-20 ENCOUNTER — Other Ambulatory Visit: Payer: Self-pay

## 2020-03-20 ENCOUNTER — Ambulatory Visit (INDEPENDENT_AMBULATORY_CARE_PROVIDER_SITE_OTHER): Payer: 59 | Admitting: Orthopedic Surgery

## 2020-03-20 VITALS — Ht 68.0 in | Wt 201.0 lb

## 2020-03-20 DIAGNOSIS — Z9889 Other specified postprocedural states: Secondary | ICD-10-CM

## 2020-03-20 DIAGNOSIS — R2689 Other abnormalities of gait and mobility: Secondary | ICD-10-CM

## 2020-03-20 NOTE — Addendum Note (Signed)
Addended byCaffie Damme on: 03/20/2020 11:34 AM   Modules accepted: Orders

## 2020-03-20 NOTE — Patient Instructions (Signed)
Continue home exercises.

## 2020-03-20 NOTE — Progress Notes (Signed)
Chief Complaint  Patient presents with  . Knee Pain    still feels off balance with knee and having some hip pain. he feels like he has no balance  . Ankle Pain    left feels like its not helping stand   Surgery date June 29th 2021  H/O OA both ankles and balance problem for 15 yrs   C/o balance problem   C/o cant bend my knee  On exam patient had limited active range of motion proximal 115 degrees passive 125 degrees and on further evaluation says that he cannot bend either knee very well actually  Still having some lateral pain  Still having some balance issues we offer him balance program  Recommend home exercises.  Balance therapy if he can afford it  He will go back to work soon he will let us know when he wants his note

## 2020-03-21 ENCOUNTER — Ambulatory Visit (HOSPITAL_COMMUNITY): Payer: 59 | Attending: Orthopedic Surgery | Admitting: Physical Therapy

## 2020-03-21 ENCOUNTER — Telehealth: Payer: Self-pay | Admitting: Orthopedic Surgery

## 2020-03-21 ENCOUNTER — Encounter: Payer: Self-pay | Admitting: Orthopedic Surgery

## 2020-03-21 NOTE — Telephone Encounter (Signed)
Ok

## 2020-03-21 NOTE — Telephone Encounter (Signed)
Called patient to notify - left message note ready for pick up.

## 2020-03-21 NOTE — Telephone Encounter (Signed)
Patient came by office - states he was just informed that his short-term disability is running out, and that he needs to go back to work. Asking for updated note to return to work, light duty, Monday, 04/01/20. Aware we will call him as soon as possible if Dr Romeo Apple approves the note.  Patient states also that he did not attend the physical therapy appointment this afternoon - advised to call them directly.

## 2020-03-25 ENCOUNTER — Other Ambulatory Visit: Payer: Self-pay | Admitting: Orthopedic Surgery

## 2020-03-25 NOTE — Telephone Encounter (Signed)
Patient requests refill on Hydrocodone/Acetaminphen  7.5-325  Mgs.  Qty  42  Sig: Take 1 tablet by mouth every 4 (four) hours as needed for moderate pain.  Patient states he uses [facility]

## 2020-03-29 ENCOUNTER — Telehealth: Payer: Self-pay

## 2020-03-29 ENCOUNTER — Other Ambulatory Visit: Payer: Self-pay

## 2020-03-29 NOTE — Telephone Encounter (Signed)
Patient is asking for a note to go back to work on 04/08/20 full duty. States his employer will not let him come back to work unless he is released for full duty. His return appointment is 04/18/20.

## 2020-03-29 NOTE — Telephone Encounter (Signed)
Hydrocodone-Acetaminophen 7.5/325 mg  Qty 42 Tablets  Take 1 tablet by mouth every 4 (four) hours as needed for moderate pain   PATIENT USES [facility] [address]

## 2020-03-29 NOTE — Telephone Encounter (Signed)
Ok to provide note full duty go back 04/08/20

## 2020-04-05 ENCOUNTER — Other Ambulatory Visit (HOSPITAL_COMMUNITY): Payer: Self-pay | Admitting: Internal Medicine

## 2020-04-05 ENCOUNTER — Other Ambulatory Visit: Payer: Self-pay | Admitting: Internal Medicine

## 2020-04-05 DIAGNOSIS — R109 Unspecified abdominal pain: Secondary | ICD-10-CM

## 2020-04-18 ENCOUNTER — Ambulatory Visit: Payer: 59 | Admitting: Orthopedic Surgery

## 2020-04-19 ENCOUNTER — Ambulatory Visit: Payer: 59 | Admitting: Orthopedic Surgery

## 2020-05-30 ENCOUNTER — Other Ambulatory Visit (HOSPITAL_COMMUNITY)
Admission: RE | Admit: 2020-05-30 | Discharge: 2020-05-30 | Disposition: A | Discharge status: Home / Self Care | Payer: 59 | Source: Ambulatory Visit | Attending: Physician Assistant | Admitting: Physician Assistant

## 2020-05-30 ENCOUNTER — Encounter: Payer: Self-pay | Admitting: Physician Assistant

## 2020-05-30 ENCOUNTER — Ambulatory Visit: Payer: Self-pay | Admitting: Physician Assistant

## 2020-05-30 ENCOUNTER — Other Ambulatory Visit: Payer: Self-pay

## 2020-05-30 VITALS — BP 120/66 | HR 63 | Temp 98.2°F | Ht 66.0 in | Wt 201.0 lb

## 2020-05-30 DIAGNOSIS — Z125 Encounter for screening for malignant neoplasm of prostate: Secondary | ICD-10-CM

## 2020-05-30 DIAGNOSIS — I10 Essential (primary) hypertension: Secondary | ICD-10-CM

## 2020-05-30 DIAGNOSIS — E1165 Type 2 diabetes mellitus with hyperglycemia: Secondary | ICD-10-CM

## 2020-05-30 DIAGNOSIS — Z7689 Persons encountering health services in other specified circumstances: Secondary | ICD-10-CM

## 2020-05-30 DIAGNOSIS — E669 Obesity, unspecified: Secondary | ICD-10-CM

## 2020-05-30 LAB — COMPREHENSIVE METABOLIC PANEL
ALT: 14 U/L (ref 0–44)
AST: 15 U/L (ref 15–41)
Albumin: 4 g/dL (ref 3.5–5.0)
Alkaline Phosphatase: 60 U/L (ref 38–126)
Anion gap: 10 (ref 5–15)
BUN: 18 mg/dL (ref 8–23)
CO2: 24 mmol/L (ref 22–32)
Calcium: 9.1 mg/dL (ref 8.9–10.3)
Chloride: 104 mmol/L (ref 98–111)
Creatinine, Ser: 0.78 mg/dL (ref 0.61–1.24)
GFR, Estimated: >60 mL/min (ref >60)
Glucose, Bld: 135 mg/dL — ABNORMAL HIGH (ref 70–99)
Potassium: 4 mmol/L (ref 3.5–5.1)
Sodium: 138 mmol/L (ref 135–145)
Total Bilirubin: 0.9 mg/dL (ref 0.3–1.2)
Total Protein: 7.2 g/dL (ref 6.5–8.1)

## 2020-05-30 LAB — LIPID PANEL
Cholesterol: 113 mg/dL (ref 0–200)
HDL: 26 mg/dL — ABNORMAL LOW (ref >40)
LDL Cholesterol: 68 mg/dL (ref 0–99)
Total CHOL/HDL Ratio: 4.3 RATIO
Triglycerides: 97 mg/dL (ref <150)
VLDL: 19 mg/dL (ref 0–40)

## 2020-05-30 LAB — PSA: Prostatic Specific Antigen: 0.44 ng/mL (ref 0.00–4.00)

## 2020-05-30 MED ORDER — METFORMIN HCL 1000 MG PO TABS: 1000.0000 mg | ORAL_TABLET | Freq: Two times a day (BID) | ORAL | 0 refills | Status: DC

## 2020-05-30 MED ORDER — GLIPIZIDE 10 MG PO TABS: 10.0000 mg | ORAL_TABLET | Freq: Every day | ORAL | 0 refills | Status: DC

## 2020-05-30 MED ORDER — LOSARTAN POTASSIUM 50 MG PO TABS: 50.0000 mg | ORAL_TABLET | Freq: Every day | ORAL | 0 refills | Status: DC

## 2020-05-30 NOTE — Progress Notes (Signed)
BP 120/66   Pulse 63   Temp 98.2 F (36.8 C)   Ht 5\' 6"  (1.676 m)   Wt 201 lb (91.2 kg)   SpO2 98%   BMI 32.44 kg/m    Subjective:    Patient ID: Stanley Long, male    DOB: 01-25-1956, 64 y.o.   MRN: [id number]  HPI: Stanley Long is a 64 y.o. male presenting on 05/30/2020 for New Patient (Initial Visit)   HPI    Pt had a negative covid 19 screening questionnaire.   Pt is 64yoM who presents to establish care.    Pt previously went to [facility].  He says he was Last there about 4 months ago.  He reports that his Last a1c was between 9 and 10.  Glipizide was added to metformin to help with that.     He has had several colonscopy - 2011-by dr 08-04-1971 normal.  - he has appt to see GI in November.0  He Recenty had knee surgery.       Relevant past medical, surgical, family and social history reviewed and updated as indicated. Interim medical history since our last visit reviewed. Allergies and medications reviewed and updated.      Current Outpatient Medications:  .  aspirin EC 81 MG tablet, Take 81 mg by mouth daily. Swallow whole., Disp: , Rfl:  .  glipiZIDE (GLUCOTROL) 10 MG tablet, Take 10 mg by mouth daily before breakfast., Disp: , Rfl:  .  lansoprazole (PREVACID) 30 MG capsule, Take 30 mg by mouth 2 (two) times daily before a meal., Disp: , Rfl:  .  losartan (COZAAR) 50 MG tablet, Take 50 mg by mouth daily., Disp: , Rfl:  .  lovastatin (MEVACOR) 10 MG tablet, Take 10 mg by mouth at bedtime., Disp: , Rfl:  .  metFORMIN (GLUCOPHAGE) 500 MG tablet, Take 1,000 mg by mouth 2 (two) times daily with a meal. , Disp: , Rfl:  .  Omega-3 Fatty Acids (FISH OIL) 1000 MG CAPS, Take 1 capsule by mouth in the morning and at bedtime., Disp: , Rfl:  .  omeprazole (PRILOSEC) 20 MG capsule, Take 20 mg by mouth daily., Disp: , Rfl:      Review of Systems  Per HPI unless specifically indicated above     Objective:    BP 120/66   Pulse 63   Temp 98.2 F (36.8 C)    Ht 5\' 6"  (1.676 m)   Wt 201 lb (91.2 kg)   SpO2 98%   BMI 32.44 kg/m   Wt Readings from Last 3 Encounters:  05/30/20 201 lb (91.2 kg)  03/20/20 201 lb (91.2 kg)  02/13/20 194 lb 0.1 oz (88 kg)    Physical Exam Vitals reviewed.  Constitutional:      Appearance: He is well-developed.  HENT:     Head: Normocephalic and atraumatic.     Mouth/Throat:     Pharynx: No oropharyngeal exudate.  Eyes:     Conjunctiva/sclera: Conjunctivae normal.     Pupils: Pupils are equal, round, and reactive to light.  Neck:     Thyroid: No thyromegaly.  Cardiovascular:     Rate and Rhythm: Normal rate and regular rhythm.  Pulmonary:     Effort: Pulmonary effort is normal.     Breath sounds: Normal breath sounds. No wheezing or rales.  Abdominal:     General: Bowel sounds are normal.     Palpations: Abdomen is soft. There is no mass.  Tenderness: There is no abdominal tenderness.  Musculoskeletal:     Cervical back: Neck supple.     Right lower leg: No edema.     Left lower leg: No edema.  Lymphadenopathy:     Cervical: No cervical adenopathy.  Skin:    General: Skin is warm and dry.     Findings: No rash.  Neurological:     Mental Status: He is alert and oriented to person, place, and time.     Motor: No weakness or tremor.  Psychiatric:        Behavior: Behavior normal.         Assessment & Plan:    Encounter Diagnoses  Name Primary?  . Encounter to establish care Yes  . Uncontrolled type 2 diabetes mellitus with hyperglycemia (HCC)   . Primary hypertension   . Screening for prostate cancer   . Obesity, unspecified classification, unspecified obesity type, unspecified whether serious comorbidity present      -Update labs -No changes to medications today -Pt to follow up 1 month to review labs.  He is to contact office sooner prn

## 2020-05-31 LAB — HEMOGLOBIN A1C
Hgb A1c MFr Bld: 6.6 % — ABNORMAL HIGH (ref 4.8–5.6)
Mean Plasma Glucose: 143 mg/dL

## 2020-05-31 LAB — MICROALBUMIN, URINE: Microalb, Ur: 19.1 ug/mL — ABNORMAL HIGH

## 2020-06-17 NOTE — Progress Notes (Signed)
Error

## 2020-06-20 ENCOUNTER — Ambulatory Visit: Payer: Self-pay | Admitting: Physician Assistant

## 2020-06-20 ENCOUNTER — Encounter (INDEPENDENT_AMBULATORY_CARE_PROVIDER_SITE_OTHER): Payer: Self-pay | Admitting: Gastroenterology

## 2020-06-20 ENCOUNTER — Other Ambulatory Visit: Payer: Self-pay

## 2020-06-20 ENCOUNTER — Ambulatory Visit (INDEPENDENT_AMBULATORY_CARE_PROVIDER_SITE_OTHER): Payer: 59 | Admitting: Gastroenterology

## 2020-06-20 DIAGNOSIS — K589 Irritable bowel syndrome without diarrhea: Secondary | ICD-10-CM

## 2020-06-20 NOTE — Patient Instructions (Addendum)
Explained presumed etiology of IBS symptoms. Patient was counseled about the benefit of implementing a low FODMAP to improve symptoms and recurrent episodes. A dietary list was provided to the patient. Also, the patient was counseled about the benefit of avoiding stressing situations and potential environmental triggers leading to symptomatology. Start peppermint oil/IBGard 1 tablet every 8-12 hours as needed

## 2020-06-20 NOTE — Progress Notes (Signed)
Katrinka Blazing, M.D. Gastroenterology & Hepatology [facility] [address] [place], [place] [address] Primary Care Physician: Jacquelin Hawking, Cordelia Poche [address] [place] [address]  Referring MD: PCP  I will communicate my assessment and recommendations to the referring MD via EMR. Note: Occasional unusual wording and randomly placed punctuation marks may result from the use of speech recognition technology to transcribe this document"  Chief Complaint: Abdominal bloating and discomfort  History of Present Illness: Stanley Long is a 64 y.o. male with PMH DM, HLD, HTN , who presents for evaluation of abdominal bloating and discomfort.  Patient states that close to 2 months he got "food poisoning" after eating in a restaurant - he states he had diarrhea and bloating at that time. States that his wife's son and the step son's wife were sick as well.  Ever since then, he has presented episodes of dark urine abdominal bloating and discomfort.  Due to this he saw his PCP and he was prescribed two antibiotics  and acid-reflux medication (unknown which medications) but he did not present major improvement. His main complaint right now is the persistent episodes of bloating. He stopped eating peanut butter as he has noticed this causes significant bloating.  He also has noticed onions cause significant discomfort. He reports that no matter what he eats he has bloating during the day. He has had to sleep laying on his back as he has presented significant bloating. Denies any abdominal pain. Has also presented recurrent episodes of burping and flatulence.  States that on average he has 1-3 BMs per day. His diarrhea has recently improved, but occasionally he has episodes of fecal urgency and explosive bowel movements.  Notably, when I asked him about previous episodes, he reports his bloating was present intermittently in the past but "never as  severe as it is now".  Patient was ordered to have a CT of the abdomen but he has not had it done yet as he is not currently insured.  The patient denies having any nausea, vomiting, fever, chills, hematochezia, melena, hematemesis, diarrhea, jaundice, pruritus or weight loss.  Last Colonoscopy: 2011 - normal per patient, no report available.  FHx: neg for any gastrointestinal/liver disease, no malignancies Social: neg smoking, alcohol or illicit drug use Surgical: non contributory  Past Medical History: Past Medical History:  Diagnosis Date  . Diabetes mellitus without complication (HCC)   . GERD (gastroesophageal reflux disease)   . Hypercholesteremia   . Hypertension     Past Surgical History: Past Surgical History:  Procedure Laterality Date  . ABDOMINOPLASTY    . amputation great toe Left   . BACK SURGERY    . KNEE ARTHROSCOPY WITH LATERAL MENISECTOMY Right 02/13/2020   Procedure: KNEE ARTHROSCOPY WITH LATERAL MENISCECTOMY Right;  Surgeon: Vickki Hearing, MD;  Location: [facility];  Service: Orthopedics;  Laterality: Right;  . SPINE SURGERY     cyst removal    Family History: Family History  Problem Relation Age of Onset  . Diabetes Mother   . Hypertension Mother   . Stroke Mother   . Diabetes Father   . Heart disease Father     Social History: Social History   Tobacco Use  Smoking Status Never Smoker  Smokeless Tobacco Never Used   Social History   Substance and Sexual Activity  Alcohol Use Not Currently   Social History   Substance and Sexual Activity  Drug Use Never    Allergies:  No Known Allergies  Medications: Current Outpatient Medications  Medication Sig Dispense Refill  . aspirin EC 81 MG tablet Take 81 mg by mouth daily. Swallow whole.    . Cinnamon 500 MG TABS Take 500 mg by mouth in the morning and at bedtime.    . ERGOCALCIFEROL PO Take 1 tablet by mouth daily.    Marland Kitchen glipiZIDE (GLUCOTROL) 10 MG tablet Take 1 tablet (10 mg total) by  mouth daily before breakfast. 30 tablet 0  . lansoprazole (PREVACID) 30 MG capsule Take 30 mg by mouth 2 (two) times daily before a meal.    . losartan (COZAAR) 50 MG tablet Take 1 tablet (50 mg total) by mouth daily. 30 tablet 0  . lovastatin (MEVACOR) 10 MG tablet Take 10 mg by mouth at bedtime.    . metFORMIN (GLUCOPHAGE) 1000 MG tablet Take 1 tablet (1,000 mg total) by mouth 2 (two) times daily with a meal. 60 tablet 0  . Omega-3 Fatty Acids (FISH OIL) 1000 MG CAPS Take 1 capsule by mouth in the morning and at bedtime.    Marland Kitchen omeprazole (PRILOSEC) 20 MG capsule Take 20 mg by mouth daily.    . vitamin C (ASCORBIC ACID) 250 MG tablet Take 250 mg by mouth 2 (two) times daily.    . vitamin E 1000 UNIT capsule Take 1,000 Units by mouth daily.     No current facility-administered medications for this visit.    Review of Systems: GENERAL: negative for malaise, night sweats HEENT: No changes in hearing or vision, no nose bleeds or other nasal problems. NECK: Negative for lumps, goiter, pain and significant neck swelling RESPIRATORY: Negative for cough, wheezing CARDIOVASCULAR: Negative for chest pain, leg swelling, palpitations, orthopnea GI: SEE HPI MUSCULOSKELETAL: Negative for joint pain or swelling, back pain, and muscle pain. SKIN: Negative for lesions, rash PSYCH: Negative for sleep disturbance, mood disorder and recent psychosocial stressors. HEMATOLOGY Negative for prolonged bleeding, bruising easily, and swollen nodes. ENDOCRINE: Negative for cold or heat intolerance, polyuria, polydipsia and goiter. NEURO: negative for tremor, gait imbalance, syncope and seizures. The remainder of the review of systems is noncontributory.   Physical Exam: BP (!) 150/77 (BP Location: Right Arm, Patient Position: Sitting, Cuff Size: Large)   Pulse 67   Temp 99.1 F (37.3 C) (Oral)   Ht 5\' 6"  (1.676 m)   Wt 207 lb 3.2 oz (94 kg)   BMI 33.44 kg/m  GENERAL: The patient is AO x3, in no acute  distress. HEENT: Head is normocephalic and atraumatic. EOMI are intact. Mouth is well hydrated and without lesions. NECK: Supple. No masses LUNGS: Clear to auscultation. No presence of rhonchi/wheezing/rales. Adequate chest expansion HEART: RRR, normal s1 and s2. ABDOMEN: Soft, nontender, no guarding, no peritoneal signs.  There is mild diffuse distention and abdomen is tympanic. BS +. No masses. EXTREMITIES: Without any cyanosis, clubbing, rash, lesions or edema. NEUROLOGIC: AOx3, no focal motor deficit. SKIN: no jaundice, no rashes   Imaging/Labs: as above  I personally reviewed and interpreted the available labs, imaging and endoscopic files.  Impression and Plan: Stanley Long is a 64 y.o. male with PMH DM, HLD, HTN , who presents for evaluation of abdominal bloating and discomfort.  The patient has presented recurrent symptoms which seem to be related to his food intake.  He has not presented any red flag signs such as weight loss or severe changes in his bowel movements.  I consider his symptoms are likely functional in nature, for which  he will benefit from implementing a low FODMAP diet as well as taking peppermint oil as needed.  I advised him that performing a colonoscopy or a CT scan could be the next step in his evaluation but we may proceed with this if his symptoms persist despite diet these other measures, as he has no insurance coverage at the moment.  Patient understood and agreed.  - Explained presumed etiology of IBS symptoms. Patient was counseled about the benefit of implementing a low FODMAP to improve symptoms and recurrent episodes. A dietary list was provided to the patient. Also, the patient was counseled about the benefit of avoiding stressing situations and potential environmental triggers leading to symptomatology. - Start peppermint oil/IBGard 1 tablet every 8-12 hours as needed   All questions were answered.      Katrinka Blazing, MD Gastroenterology and  Hepatology [facility]

## 2020-06-24 ENCOUNTER — Encounter: Payer: Self-pay | Admitting: Student

## 2020-07-02 ENCOUNTER — Other Ambulatory Visit: Payer: Self-pay | Admitting: Physician Assistant

## 2020-07-08 ENCOUNTER — Other Ambulatory Visit: Payer: Self-pay | Admitting: Family Medicine

## 2020-07-08 DIAGNOSIS — M23300 Other meniscus derangements, unspecified lateral meniscus, right knee: Secondary | ICD-10-CM

## 2020-07-14 ENCOUNTER — Other Ambulatory Visit: Payer: Self-pay | Admitting: Physician Assistant

## 2020-07-17 ENCOUNTER — Encounter: Payer: Self-pay | Admitting: Physician Assistant

## 2020-07-17 ENCOUNTER — Ambulatory Visit: Payer: Self-pay | Admitting: Physician Assistant

## 2020-07-17 VITALS — BP 124/60 | HR 67 | Temp 97.9°F | Ht 66.0 in | Wt 203.2 lb

## 2020-07-17 DIAGNOSIS — E119 Type 2 diabetes mellitus without complications: Secondary | ICD-10-CM

## 2020-07-17 DIAGNOSIS — I10 Essential (primary) hypertension: Secondary | ICD-10-CM

## 2020-07-17 MED ORDER — GLIPIZIDE 10 MG PO TABS
10.0000 mg | ORAL_TABLET | Freq: Every day | ORAL | 0 refills | Status: DC
Start: 2020-07-17 — End: 2020-10-14

## 2020-07-17 MED ORDER — OMEPRAZOLE 20 MG PO CPDR
20.0000 mg | DELAYED_RELEASE_CAPSULE | Freq: Every day | ORAL | 0 refills | Status: DC
Start: 2020-07-17 — End: 2020-10-14

## 2020-07-17 MED ORDER — LOSARTAN POTASSIUM 50 MG PO TABS
50.0000 mg | ORAL_TABLET | Freq: Every day | ORAL | 0 refills | Status: DC
Start: 2020-07-17 — End: 2020-10-14

## 2020-07-17 MED ORDER — ATORVASTATIN CALCIUM 10 MG PO TABS: 10.0000 mg | ORAL_TABLET | Freq: Every day | ORAL | 0 refills | Status: DC

## 2020-07-17 MED ORDER — METFORMIN HCL 1000 MG PO TABS
1000.0000 mg | ORAL_TABLET | Freq: Two times a day (BID) | ORAL | 0 refills | Status: DC
Start: 2020-07-17 — End: 2020-10-14

## 2020-07-17 NOTE — Progress Notes (Signed)
BP 124/60    Pulse 67    Temp 97.9 F (36.6 C)    Ht 5\' 6"  (1.676 m)    Wt 203 lb 3.2 oz (92.2 kg)    SpO2 97%    BMI 32.80 kg/m    Subjective:    Patient ID: Stanley Long, male    DOB: [birth year], 64 y.o.   MRN: [id number]  HPI: Stanley Long is a 64 y.o. male presenting on 07/17/2020 for Diabetes and Hypertension   HPI    Pt had a negative covid 19 screening questionnaire.  Chief Complaint  Patient presents with   Diabetes   Hypertension    Pt says he is doing well and has no complaints other than having some problems with sleep.     Relevant past medical, surgical, family and social history reviewed and updated as indicated. Interim medical history since our last visit reviewed. Allergies and medications reviewed and updated.   Current Outpatient Medications:    aspirin EC 81 MG tablet, Take 81 mg by mouth daily. Swallow whole., Disp: , Rfl:    Cinnamon 500 MG TABS, Take 500 mg by mouth in the morning and at bedtime., Disp: , Rfl:    ERGOCALCIFEROL PO, Take 1 tablet by mouth daily., Disp: , Rfl:    glipiZIDE (GLUCOTROL) 10 MG tablet, Take 1 tablet (10 mg total) by mouth daily before breakfast., Disp: 30 tablet, Rfl: 0   losartan (COZAAR) 50 MG tablet, Take 1 tablet by mouth once daily, Disp: 30 tablet, Rfl: 0   lovastatin (MEVACOR) 10 MG tablet, Take 10 mg by mouth at bedtime., Disp: , Rfl:    metFORMIN (GLUCOPHAGE) 1000 MG tablet, Take 1 tablet (1,000 mg total) by mouth 2 (two) times daily with a meal., Disp: 60 tablet, Rfl: 0   Omega-3 Fatty Acids (FISH OIL) 1000 MG CAPS, Take 1 capsule by mouth in the morning and at bedtime., Disp: , Rfl:    omeprazole (PRILOSEC) 20 MG capsule, Take 20 mg by mouth daily., Disp: , Rfl:    vitamin C (ASCORBIC ACID) 250 MG tablet, Take 250 mg by mouth 2 (two) times daily., Disp: , Rfl:    vitamin E 1000 UNIT capsule, Take 1,000 Units by mouth daily., Disp: , Rfl:     Review of Systems  Per HPI unless specifically  indicated above     Objective:    BP 124/60    Pulse 67    Temp 97.9 F (36.6 C)    Ht 5\' 6"  (1.676 m)    Wt 203 lb 3.2 oz (92.2 kg)    SpO2 97%    BMI 32.80 kg/m   Wt Readings from Last 3 Encounters:  07/17/20 203 lb 3.2 oz (92.2 kg)  06/20/20 207 lb 3.2 oz (94 kg)  05/30/20 201 lb (91.2 kg)    Physical Exam Vitals reviewed.  Constitutional:      General: He is not in acute distress.    Appearance: He is well-developed. He is not ill-appearing.  HENT:     Head: Normocephalic and atraumatic.  Cardiovascular:     Rate and Rhythm: Normal rate and regular rhythm.  Pulmonary:     Effort: Pulmonary effort is normal.     Breath sounds: Normal breath sounds. No wheezing.  Abdominal:     General: Bowel sounds are normal.     Palpations: Abdomen is soft.     Tenderness: There is no abdominal tenderness.  Musculoskeletal:  Cervical back: Neck supple.     Right lower leg: No edema.     Left lower leg: No edema.  Lymphadenopathy:     Cervical: No cervical adenopathy.  Skin:    General: Skin is warm and dry.  Neurological:     Mental Status: He is alert and oriented to person, place, and time.  Psychiatric:        Behavior: Behavior normal.     Results for orders placed or performed during the hospital encounter of 05/30/20  Lipid panel  Result Value Ref Range   Cholesterol 113 0 - 200 mg/dL   Triglycerides 97 <384 mg/dL   HDL 26 (L) >66 mg/dL   Total CHOL/HDL Ratio 4.3 RATIO   VLDL 19 0 - 40 mg/dL   LDL Cholesterol 68 0 - 99 mg/dL  PSA  Result Value Ref Range   Prostatic Specific Antigen 0.44 0.00 - 4.00 ng/mL  Microalbumin, urine  Result Value Ref Range   Microalb, Ur 19.1 (H) Not Estab. ug/mL  Hemoglobin A1c  Result Value Ref Range   Hgb A1c MFr Bld 6.6 (H) 4.8 - 5.6 %   Mean Plasma Glucose 143 mg/dL  Comprehensive metabolic panel  Result Value Ref Range   Sodium 138 135 - 145 mmol/L   Potassium 4.0 3.5 - 5.1 mmol/L   Chloride 104 98 - 111 mmol/L   CO2 24  22 - 32 mmol/L   Glucose, Bld 135 (H) 70 - 99 mg/dL   BUN 18 8 - 23 mg/dL   Creatinine, Ser 5.99 0.61 - 1.24 mg/dL   Calcium 9.1 8.9 - 35.7 mg/dL   Total Protein 7.2 6.5 - 8.1 g/dL   Albumin 4.0 3.5 - 5.0 g/dL   AST 15 15 - 41 U/L   ALT 14 0 - 44 U/L   Alkaline Phosphatase 60 38 - 126 U/L   Total Bilirubin 0.9 0.3 - 1.2 mg/dL   GFR, Estimated >01 >77 mL/min   Anion gap 10 5 - 15      Assessment & Plan:   Encounter Diagnoses  Name Primary?   Diabetes mellitus without complication (HCC) Yes   Primary hypertension       -reviewed labs with pt  -recomended relaxation techniques and melatonin for sleep -pt to Continue current meds -pt to follow up 3 months.  He is to contact office sooner prn

## 2020-09-23 ENCOUNTER — Telehealth: Payer: Self-pay

## 2020-10-02 ENCOUNTER — Other Ambulatory Visit: Payer: Self-pay | Admitting: Physician Assistant

## 2020-10-02 DIAGNOSIS — E119 Type 2 diabetes mellitus without complications: Secondary | ICD-10-CM

## 2020-10-02 DIAGNOSIS — I10 Essential (primary) hypertension: Secondary | ICD-10-CM

## 2020-10-09 ENCOUNTER — Other Ambulatory Visit: Payer: Self-pay

## 2020-10-09 ENCOUNTER — Other Ambulatory Visit (HOSPITAL_COMMUNITY)
Admission: RE | Admit: 2020-10-09 | Discharge: 2020-10-09 | Disposition: A | Discharge status: Home / Self Care | Payer: Self-pay | Source: Ambulatory Visit | Attending: Physician Assistant | Admitting: Physician Assistant

## 2020-10-09 DIAGNOSIS — I10 Essential (primary) hypertension: Secondary | ICD-10-CM | POA: Insufficient documentation

## 2020-10-09 DIAGNOSIS — E119 Type 2 diabetes mellitus without complications: Secondary | ICD-10-CM | POA: Insufficient documentation

## 2020-10-09 LAB — COMPREHENSIVE METABOLIC PANEL
ALT: 23 U/L (ref 0–44)
AST: 17 U/L (ref 15–41)
Albumin: 4 g/dL (ref 3.5–5.0)
Alkaline Phosphatase: 72 U/L (ref 38–126)
Anion gap: 10 (ref 5–15)
BUN: 16 mg/dL (ref 8–23)
CO2: 24 mmol/L (ref 22–32)
Calcium: 9.4 mg/dL (ref 8.9–10.3)
Chloride: 103 mmol/L (ref 98–111)
Creatinine, Ser: 0.83 mg/dL (ref 0.61–1.24)
GFR, Estimated: >60 mL/min (ref >60)
Glucose, Bld: 257 mg/dL — ABNORMAL HIGH (ref 70–99)
Potassium: 4.2 mmol/L (ref 3.5–5.1)
Sodium: 137 mmol/L (ref 135–145)
Total Bilirubin: 0.7 mg/dL (ref 0.3–1.2)
Total Protein: 7.3 g/dL (ref 6.5–8.1)

## 2020-10-09 LAB — LIPID PANEL
Cholesterol: 118 mg/dL (ref 0–200)
HDL: 32 mg/dL — ABNORMAL LOW (ref >40)
LDL Cholesterol: 48 mg/dL (ref 0–99)
Total CHOL/HDL Ratio: 3.7 RATIO
Triglycerides: 191 mg/dL — ABNORMAL HIGH (ref <150)
VLDL: 38 mg/dL (ref 0–40)

## 2020-10-09 LAB — HEMOGLOBIN A1C
Hgb A1c MFr Bld: 7.7 % — ABNORMAL HIGH (ref 4.8–5.6)
Mean Plasma Glucose: 174.29 mg/dL

## 2020-10-14 ENCOUNTER — Other Ambulatory Visit: Payer: Self-pay

## 2020-10-14 ENCOUNTER — Ambulatory Visit: Payer: Self-pay | Admitting: Physician Assistant

## 2020-10-14 ENCOUNTER — Encounter: Payer: Self-pay | Admitting: Physician Assistant

## 2020-10-14 VITALS — BP 126/74 | HR 59 | Temp 96.7°F

## 2020-10-14 DIAGNOSIS — E1165 Type 2 diabetes mellitus with hyperglycemia: Secondary | ICD-10-CM

## 2020-10-14 DIAGNOSIS — E785 Hyperlipidemia, unspecified: Secondary | ICD-10-CM

## 2020-10-14 DIAGNOSIS — I10 Essential (primary) hypertension: Secondary | ICD-10-CM

## 2020-10-14 DIAGNOSIS — E1169 Type 2 diabetes mellitus with other specified complication: Secondary | ICD-10-CM

## 2020-10-14 MED ORDER — LOSARTAN POTASSIUM 50 MG PO TABS: 50.0000 mg | ORAL_TABLET | Freq: Every day | ORAL | 0 refills | Status: DC

## 2020-10-14 MED ORDER — GLIPIZIDE 10 MG PO TABS: 10.0000 mg | ORAL_TABLET | Freq: Two times a day (BID) | ORAL | 0 refills | Status: DC

## 2020-10-14 MED ORDER — ATORVASTATIN CALCIUM 10 MG PO TABS: 10.0000 mg | ORAL_TABLET | Freq: Every day | ORAL | 0 refills | Status: DC

## 2020-10-14 MED ORDER — OMEPRAZOLE 20 MG PO CPDR: 20.0000 mg | DELAYED_RELEASE_CAPSULE | Freq: Every day | ORAL | 0 refills | Status: DC

## 2020-10-14 MED ORDER — METFORMIN HCL 1000 MG PO TABS: 1000.0000 mg | ORAL_TABLET | Freq: Two times a day (BID) | ORAL | 0 refills | Status: DC
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
## 2020-12-13 ENCOUNTER — Other Ambulatory Visit: Payer: Self-pay | Admitting: Physician Assistant

## 2020-12-30 ENCOUNTER — Other Ambulatory Visit: Payer: Self-pay | Admitting: Physician Assistant

## 2020-12-30 DIAGNOSIS — E1169 Type 2 diabetes mellitus with other specified complication: Secondary | ICD-10-CM

## 2020-12-30 DIAGNOSIS — E1165 Type 2 diabetes mellitus with hyperglycemia: Secondary | ICD-10-CM

## 2020-12-30 DIAGNOSIS — I10 Essential (primary) hypertension: Secondary | ICD-10-CM

## 2020-12-30 DIAGNOSIS — E785 Hyperlipidemia, unspecified: Secondary | ICD-10-CM

## 2021-01-14 ENCOUNTER — Ambulatory Visit: Payer: Self-pay | Admitting: Physician Assistant

## 2021-01-14 ENCOUNTER — Encounter: Payer: Self-pay | Admitting: Physician Assistant

## 2021-01-14 VITALS — BP 140/81 | HR 64 | Temp 98.1°F | Wt 210.0 lb

## 2021-01-14 DIAGNOSIS — E669 Obesity, unspecified: Secondary | ICD-10-CM

## 2021-01-14 DIAGNOSIS — I10 Essential (primary) hypertension: Secondary | ICD-10-CM

## 2021-01-14 DIAGNOSIS — E1165 Type 2 diabetes mellitus with hyperglycemia: Secondary | ICD-10-CM

## 2021-01-14 DIAGNOSIS — E1169 Type 2 diabetes mellitus with other specified complication: Secondary | ICD-10-CM

## 2021-01-14 MED ORDER — METFORMIN HCL 1000 MG PO TABS: 1000.0000 mg | ORAL_TABLET | Freq: Two times a day (BID) | ORAL | 0 refills | Status: DC

## 2021-01-14 MED ORDER — ATORVASTATIN CALCIUM 10 MG PO TABS: 10.0000 mg | ORAL_TABLET | Freq: Every day | ORAL | 0 refills | Status: DC

## 2021-01-14 MED ORDER — GLIPIZIDE 10 MG PO TABS: 10.0000 mg | ORAL_TABLET | Freq: Two times a day (BID) | ORAL | 0 refills | Status: DC

## 2021-01-14 MED ORDER — OMEPRAZOLE 20 MG PO CPDR: 20.0000 mg | DELAYED_RELEASE_CAPSULE | Freq: Every day | ORAL | 0 refills | Status: DC

## 2021-01-14 MED ORDER — LOSARTAN POTASSIUM 100 MG PO TABS: 100.0000 mg | ORAL_TABLET | Freq: Every day | ORAL | 0 refills | Status: DC

## 2021-01-14 NOTE — Progress Notes (Signed)
BP 140/81   Pulse 64   Temp 98.1 F (36.7 C)   Wt 210 lb (95.3 kg)   SpO2 98%   BMI 33.89 kg/m    Subjective:    Patient ID: Stanley Long, male    DOB: December 10, 1955, 65 y.o.   MRN: [id number]  HPI: Stanley Long is a 65 y.o. male presenting on 01/14/2021 for Diabetes, Hypertension, and Hyperlipidemia   HPI       Pt had a negative covid 19 screening questionnaire.     Chief Complaint  Patient presents with  . Diabetes  . Hypertension  . Hyperlipidemia     Pt didn't get labs drawn.  He says he didn't know he needed them.  He says he is feeling well and has no complaints.   He will be turning 65 in August and his medicare starts August 1.       Relevant past medical, surgical, family and social history reviewed and updated as indicated. Interim medical history since our last visit reviewed. Allergies and medications reviewed and updated.'    Current Outpatient Medications:  .  aspirin EC 81 MG tablet, Take 81 mg by mouth daily. Swallow whole., Disp: , Rfl:  .  Cinnamon 500 MG TABS, Take 500 mg by mouth in the morning and at bedtime., Disp: , Rfl:  .  ERGOCALCIFEROL PO, Take 1 tablet by mouth daily., Disp: , Rfl:  .  glipiZIDE (GLUCOTROL) 10 MG tablet, Take 1 tablet (10 mg total) by mouth 2 (two) times daily before a meal., Disp: 180 tablet, Rfl: 0 .  losartan (COZAAR) 50 MG tablet, Take 1 tablet by mouth once daily, Disp: 30 tablet, Rfl: 0 .  metFORMIN (GLUCOPHAGE) 1000 MG tablet, Take 1 tablet (1,000 mg total) by mouth 2 (two) times daily with a meal., Disp: 180 tablet, Rfl: 0 .  Multiple Vitamin (MULTIVITAMIN) capsule, Take 1 capsule by mouth daily., Disp: , Rfl:  .  Omega-3 Fatty Acids (FISH OIL) 1000 MG CAPS, Take 1 capsule by mouth in the morning and at bedtime., Disp: , Rfl:  .  omeprazole (PRILOSEC) 20 MG capsule, Take 1 capsule (20 mg total) by mouth daily., Disp: 90 capsule, Rfl: 0 .  vitamin C (ASCORBIC ACID) 250 MG tablet, Take 250 mg by mouth 2 (two)  times daily., Disp: , Rfl:  .  vitamin E 1000 UNIT capsule, Take 1,000 Units by mouth daily., Disp: , Rfl:  .  atorvastatin (LIPITOR) 10 MG tablet, Take 1 tablet (10 mg total) by mouth daily. (Patient not taking: Reported on 01/14/2021), Disp: 90 tablet, Rfl: 0      Review of Systems  Per HPI unless specifically indicated above     Objective:    BP 140/81   Pulse 64   Temp 98.1 F (36.7 C)   Wt 210 lb (95.3 kg)   SpO2 98%   BMI 33.89 kg/m   Wt Readings from Last 3 Encounters:  01/14/21 210 lb (95.3 kg)  07/17/20 203 lb 3.2 oz (92.2 kg)  06/20/20 207 lb 3.2 oz (94 kg)    Physical Exam Vitals reviewed.  Constitutional:      General: He is not in acute distress.    Appearance: He is well-developed. He is not toxic-appearing.  HENT:     Head: Normocephalic and atraumatic.  Cardiovascular:     Rate and Rhythm: Normal rate and regular rhythm.  Pulmonary:     Effort: Pulmonary effort is normal.  Breath sounds: Normal breath sounds. No wheezing.  Abdominal:     General: Bowel sounds are normal.     Palpations: Abdomen is soft.     Tenderness: There is no abdominal tenderness.  Musculoskeletal:     Cervical back: Neck supple.     Right lower leg: No edema.     Left lower leg: No edema.  Lymphadenopathy:     Cervical: No cervical adenopathy.  Skin:    General: Skin is warm and dry.  Neurological:     Mental Status: He is alert and oriented to person, place, and time.  Psychiatric:        Behavior: Behavior normal.                Assessment & Plan:    Encounter Diagnoses  Name Primary?  Marland Kitchen Uncontrolled type 2 diabetes mellitus with hyperglycemia (HCC) Yes  . Primary hypertension   . Hyperlipidemia associated with type 2 diabetes mellitus (HCC)   . Obesity, unspecified classification, unspecified obesity type, unspecified whether serious comorbidity present       -pt Needs to get labs drawn.  He Will called with results -losartan is increased due  to BP not controlled -Pt needs to schedule appt with new pcp so he can be seen after his insurance starts -No follow up is scheduled.  He is to Call office if needed before 03/17/21

## 2021-01-16 ENCOUNTER — Other Ambulatory Visit (HOSPITAL_COMMUNITY)
Admission: RE | Admit: 2021-01-16 | Discharge: 2021-01-16 | Disposition: A | Discharge status: Home / Self Care | Payer: Self-pay | Source: Ambulatory Visit | Attending: Physician Assistant | Admitting: Physician Assistant

## 2021-01-16 ENCOUNTER — Other Ambulatory Visit: Payer: Self-pay

## 2021-01-16 DIAGNOSIS — E1165 Type 2 diabetes mellitus with hyperglycemia: Secondary | ICD-10-CM | POA: Insufficient documentation

## 2021-01-16 DIAGNOSIS — E1169 Type 2 diabetes mellitus with other specified complication: Secondary | ICD-10-CM | POA: Insufficient documentation

## 2021-01-16 DIAGNOSIS — E785 Hyperlipidemia, unspecified: Secondary | ICD-10-CM | POA: Insufficient documentation

## 2021-01-16 DIAGNOSIS — I10 Essential (primary) hypertension: Secondary | ICD-10-CM | POA: Insufficient documentation

## 2021-01-16 LAB — COMPREHENSIVE METABOLIC PANEL
ALT: 22 U/L (ref 0–44)
AST: 18 U/L (ref 15–41)
Albumin: 4.1 g/dL (ref 3.5–5.0)
Alkaline Phosphatase: 84 U/L (ref 38–126)
Anion gap: 8 (ref 5–15)
BUN: 14 mg/dL (ref 8–23)
CO2: 26 mmol/L (ref 22–32)
Calcium: 9.1 mg/dL (ref 8.9–10.3)
Chloride: 103 mmol/L (ref 98–111)
Creatinine, Ser: 0.76 mg/dL (ref 0.61–1.24)
GFR, Estimated: >60 mL/min (ref >60)
Glucose, Bld: 257 mg/dL — ABNORMAL HIGH (ref 70–99)
Potassium: 4 mmol/L (ref 3.5–5.1)
Sodium: 137 mmol/L (ref 135–145)
Total Bilirubin: 1.1 mg/dL (ref 0.3–1.2)
Total Protein: 7.3 g/dL (ref 6.5–8.1)

## 2021-01-16 LAB — LIPID PANEL
Cholesterol: 131 mg/dL (ref 0–200)
HDL: 31 mg/dL — ABNORMAL LOW (ref >40)
LDL Cholesterol: 71 mg/dL (ref 0–99)
Total CHOL/HDL Ratio: 4.2 RATIO
Triglycerides: 145 mg/dL (ref <150)
VLDL: 29 mg/dL (ref 0–40)

## 2021-01-17 LAB — HEMOGLOBIN A1C
Hgb A1c MFr Bld: 7.8 % — ABNORMAL HIGH (ref 4.8–5.6)
Mean Plasma Glucose: 177 mg/dL

## 2021-05-14 ENCOUNTER — Other Ambulatory Visit: Payer: Self-pay | Admitting: Physician Assistant

## 2021-08-27 ENCOUNTER — Ambulatory Visit
Admission: EM | Admit: 2021-08-27 | Discharge: 2021-08-27 | Disposition: A | Discharge status: Home / Self Care | Payer: Medicare HMO | Attending: Urgent Care | Admitting: Urgent Care

## 2021-08-27 ENCOUNTER — Ambulatory Visit (INDEPENDENT_AMBULATORY_CARE_PROVIDER_SITE_OTHER): Payer: Medicare HMO

## 2021-08-27 ENCOUNTER — Other Ambulatory Visit: Payer: Self-pay

## 2021-08-27 DIAGNOSIS — S8992XA Unspecified injury of left lower leg, initial encounter: Secondary | ICD-10-CM

## 2021-08-27 DIAGNOSIS — S82002A Unspecified fracture of left patella, initial encounter for closed fracture: Secondary | ICD-10-CM

## 2021-08-27 DIAGNOSIS — M25562 Pain in left knee: Secondary | ICD-10-CM | POA: Diagnosis not present

## 2021-08-27 DIAGNOSIS — E119 Type 2 diabetes mellitus without complications: Secondary | ICD-10-CM

## 2021-08-27 MED ORDER — IBUPROFEN 400 MG PO TABS: 400.0000 mg | ORAL_TABLET | Freq: Four times a day (QID) | ORAL | 0 refills | Status: AC | PRN

## 2021-08-27 MED ORDER — HYDROCODONE-ACETAMINOPHEN 5-325 MG PO TABS: 1.0000 | ORAL_TABLET | Freq: Four times a day (QID) | ORAL | 0 refills | Status: AC | PRN

## 2021-08-27 NOTE — ED Provider Notes (Signed)
[facility]   MRN: [id number] DOB: 07/09/1956  Subjective:   Stanley Long is a 66 y.o. male presenting for 1 day history of acute onset left knee pain.  Patient accidentally fell and landed directly on his left knee today.  Has had significant difficulty bending, extending and bearing any kind of weight.  Patient does have a history of osteoarthritis of the right knee worse in the patellofemoral compartment and also has a degenerative meniscus with a history of a tear, history of small Baker's cyst and ganglion cyst as well.  No current facility-administered medications for this encounter.  Current Outpatient Medications:    aspirin EC 81 MG tablet, Take 81 mg by mouth daily. Swallow whole., Disp: , Rfl:    atorvastatin (LIPITOR) 10 MG tablet, Take 1 tablet (10 mg total) by mouth daily., Disp: 90 tablet, Rfl: 0   Cinnamon 500 MG TABS, Take 500 mg by mouth in the morning and at bedtime., Disp: , Rfl:    ERGOCALCIFEROL PO, Take 1 tablet by mouth daily., Disp: , Rfl:    glipiZIDE (GLUCOTROL) 10 MG tablet, Take 1 tablet (10 mg total) by mouth 2 (two) times daily before a meal., Disp: 180 tablet, Rfl: 0   losartan (COZAAR) 100 MG tablet, Take 1 tablet (100 mg total) by mouth daily., Disp: 90 tablet, Rfl: 0   metFORMIN (GLUCOPHAGE) 1000 MG tablet, Take 1 tablet (1,000 mg total) by mouth 2 (two) times daily with a meal., Disp: 180 tablet, Rfl: 0   Multiple Vitamin (MULTIVITAMIN) capsule, Take 1 capsule by mouth daily., Disp: , Rfl:    Omega-3 Fatty Acids (FISH OIL) 1000 MG CAPS, Take 1 capsule by mouth in the morning and at bedtime., Disp: , Rfl:    omeprazole (PRILOSEC) 20 MG capsule, Take 1 capsule (20 mg total) by mouth daily., Disp: 90 capsule, Rfl: 0   vitamin C (ASCORBIC ACID) 250 MG tablet, Take 250 mg by mouth 2 (two) times daily., Disp: , Rfl:    vitamin E 1000 UNIT capsule, Take 1,000 Units by mouth daily., Disp: , Rfl:    No Known Allergies  Past Medical History:   Diagnosis Date   Diabetes mellitus without complication (HCC)    GERD (gastroesophageal reflux disease)    Hypercholesteremia    Hypertension      Past Surgical History:  Procedure Laterality Date   ABDOMINOPLASTY     amputation great toe Left    BACK SURGERY     KNEE ARTHROSCOPY WITH LATERAL MENISECTOMY Right 02/13/2020   Procedure: KNEE ARTHROSCOPY WITH LATERAL MENISCECTOMY Right;  Surgeon: Vickki Hearing, MD;  Location: [facility];  Service: Orthopedics;  Laterality: Right;   SPINE SURGERY     cyst removal    Family History  Problem Relation Age of Onset   Diabetes Mother    Hypertension Mother    Stroke Mother    Diabetes Father    Heart disease Father     Social History   Tobacco Use   Smoking status: Never   Smokeless tobacco: Never  Vaping Use   Vaping Use: Never used  Substance Use Topics   Alcohol use: Not Currently   Drug use: Never    ROS   Objective:   Vitals: BP (!) 160/79 (BP Location: Right Arm)    Pulse 64    Temp 97.9 F (36.6 C) (Oral)    Resp 18    SpO2 95%   Physical Exam Constitutional:  General: He is not in acute distress.    Appearance: Normal appearance. He is well-developed and normal weight. He is not ill-appearing, toxic-appearing or diaphoretic.  HENT:     Head: Normocephalic and atraumatic.     Right Ear: External ear normal.     Left Ear: External ear normal.     Nose: Nose normal.     Mouth/Throat:     Pharynx: Oropharynx is clear.  Eyes:     General: No scleral icterus.       Right eye: No discharge.        Left eye: No discharge.     Extraocular Movements: Extraocular movements intact.  Cardiovascular:     Rate and Rhythm: Normal rate.  Pulmonary:     Effort: Pulmonary effort is normal.  Musculoskeletal:     Cervical back: Normal range of motion.     Left knee: Swelling and bony tenderness present. No deformity, erythema, ecchymosis, lacerations or crepitus. Decreased range of motion. Tenderness (inferior  patella) present over the patellar tendon. No medial joint line or lateral joint line tenderness. Normal alignment.  Neurological:     Mental Status: He is alert and oriented to person, place, and time.  Psychiatric:        Mood and Affect: Mood normal.        Behavior: Behavior normal.        Thought Content: Thought content normal.        Judgment: Judgment normal.   DG Knee Complete 4 Views Left  Result Date: 08/27/2021 CLINICAL DATA:  Left knee pain.  Fall. EXAM: LEFT KNEE - COMPLETE 4+ VIEW COMPARISON:  None. FINDINGS: There is a large suprapatellar joint effusion. There is an acute vertically oriented nondisplaced fracture through the lateral aspect of the patella. Joint spaces are maintained. IMPRESSION: 1. Acute nondisplaced fracture through the patella. 2. Large joint effusion. Electronically Signed   By: Darliss Cheney M.D.   On: 08/27/2021 17:29    Patient placed into a long leg immobilizer of about 20 inches.  Provided with crutches.  Assessment and Plan :   PDMP not reviewed this encounter.  1. Closed nondisplaced fracture of left patella, unspecified fracture morphology, initial encounter   2. Acute pain of left knee   3. Left knee injury, initial encounter   4. Type 2 diabetes mellitus treated without insulin (HCC)    Is to wear the leg immobilizer at all times, use crutches to ambulate if absolutely necessary.  Schedule Tylenol and ibuprofen (in the absence of heart disease) for regular pain control, hydrocodone for breakthrough pain.  Follow-up with an orthopedist as soon as possible, provided him with multiple orthopedic practices that he can follow-up with.  Counseled patient on potential for adverse effects with medications prescribed/recommended today, ER and return-to-clinic precautions discussed, patient verbalized understanding.    Wallis Bamberg, [place] 08/27/21 [id number]

## 2021-08-27 NOTE — Discharge Instructions (Addendum)
Wear the splint at all times. Use crutches to move around only if absolutely necessary. Contact Dr. Carlean Jews office or Raliegh Ip, [place] as soon as possible for follow up on your knee fracture. Use Tylenol and ibuprofen for regular pain control and hydrocodone for severe breakthrough pain. If you do not need hydrocodone then do not use it.

## 2021-08-27 NOTE — ED Triage Notes (Signed)
Patient states he was in his shop today and some rope got around his ankle and tripped him up and he fell on his left knee  Denies Meds

## 2021-10-30 IMAGING — DX DG KNEE COMPLETE 4+V*R*
4 series · 4 of 4 positions shown · non-contrast
Comparison: None.

CLINICAL DATA: Right knee pain with catching.

EXAM:
RIGHT KNEE - COMPLETE 4+ VIEW

[knee ap]
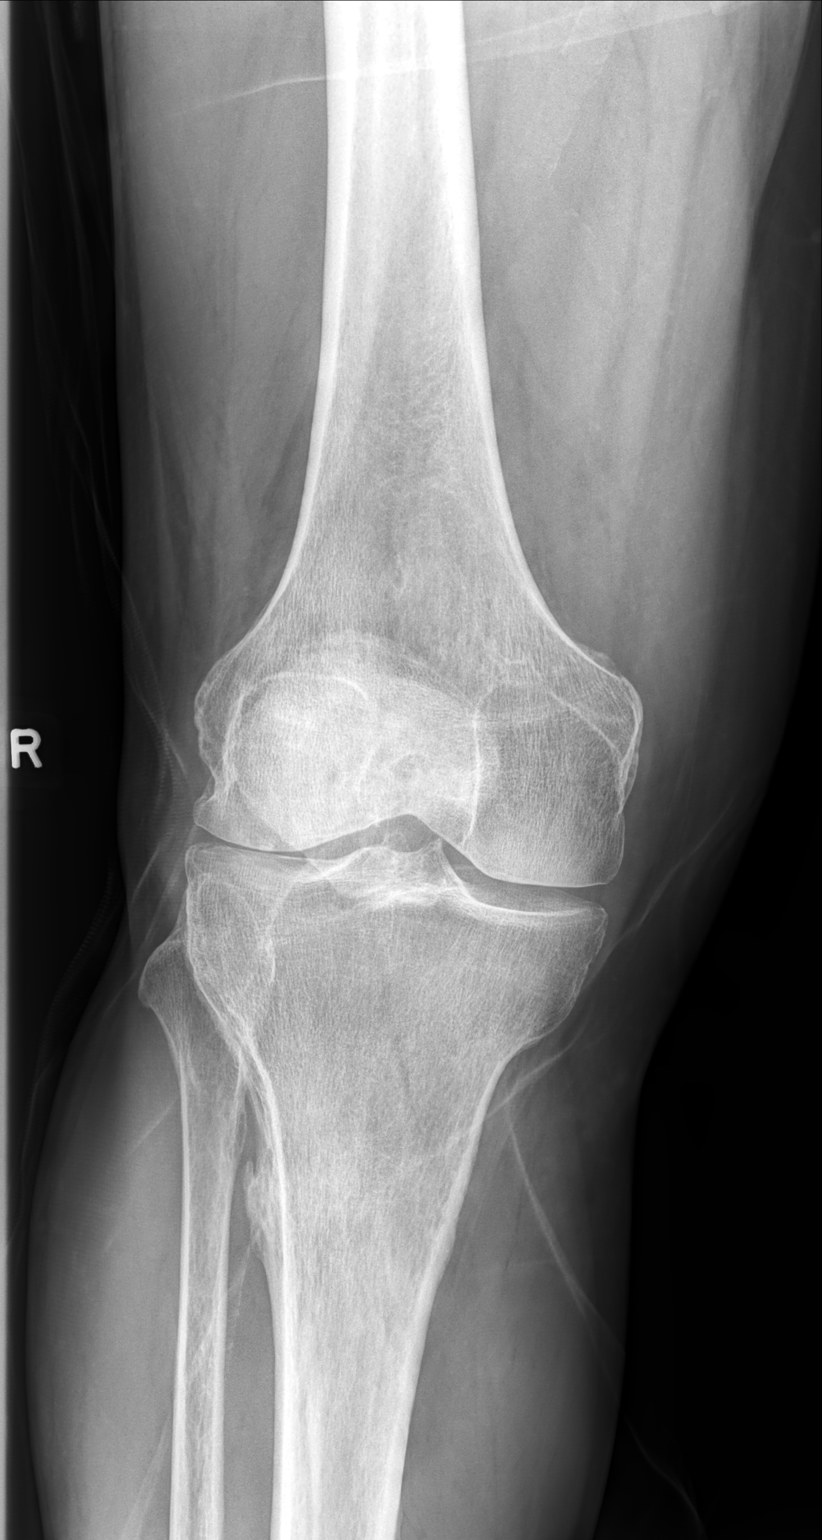

[knee mlo (1 of 2)]
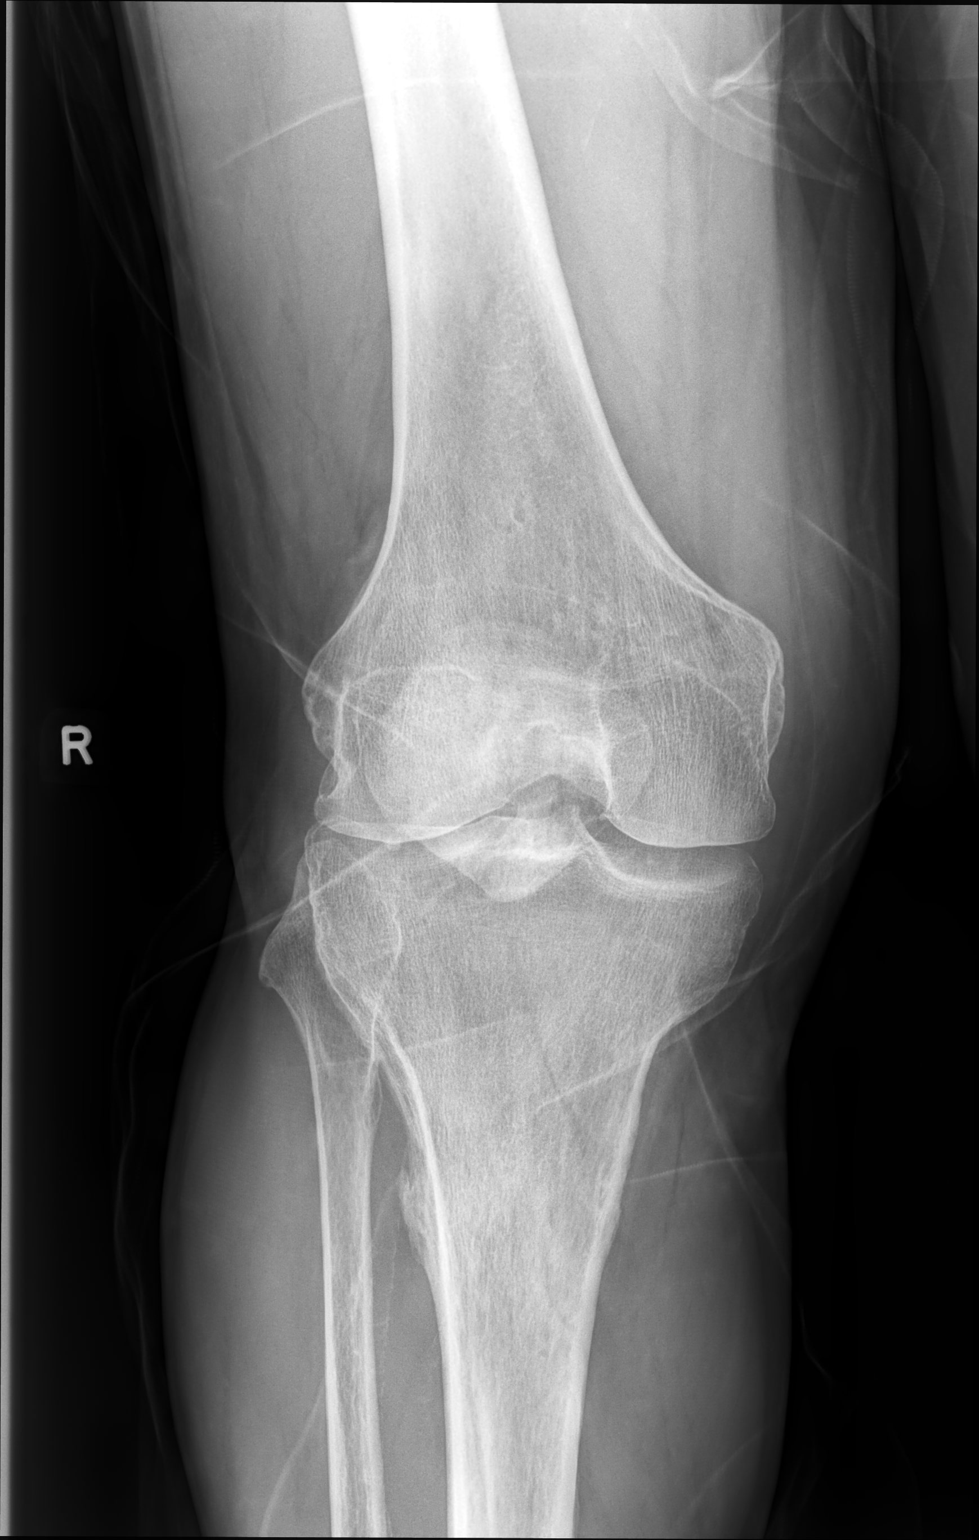

[knee mlo (2 of 2)]
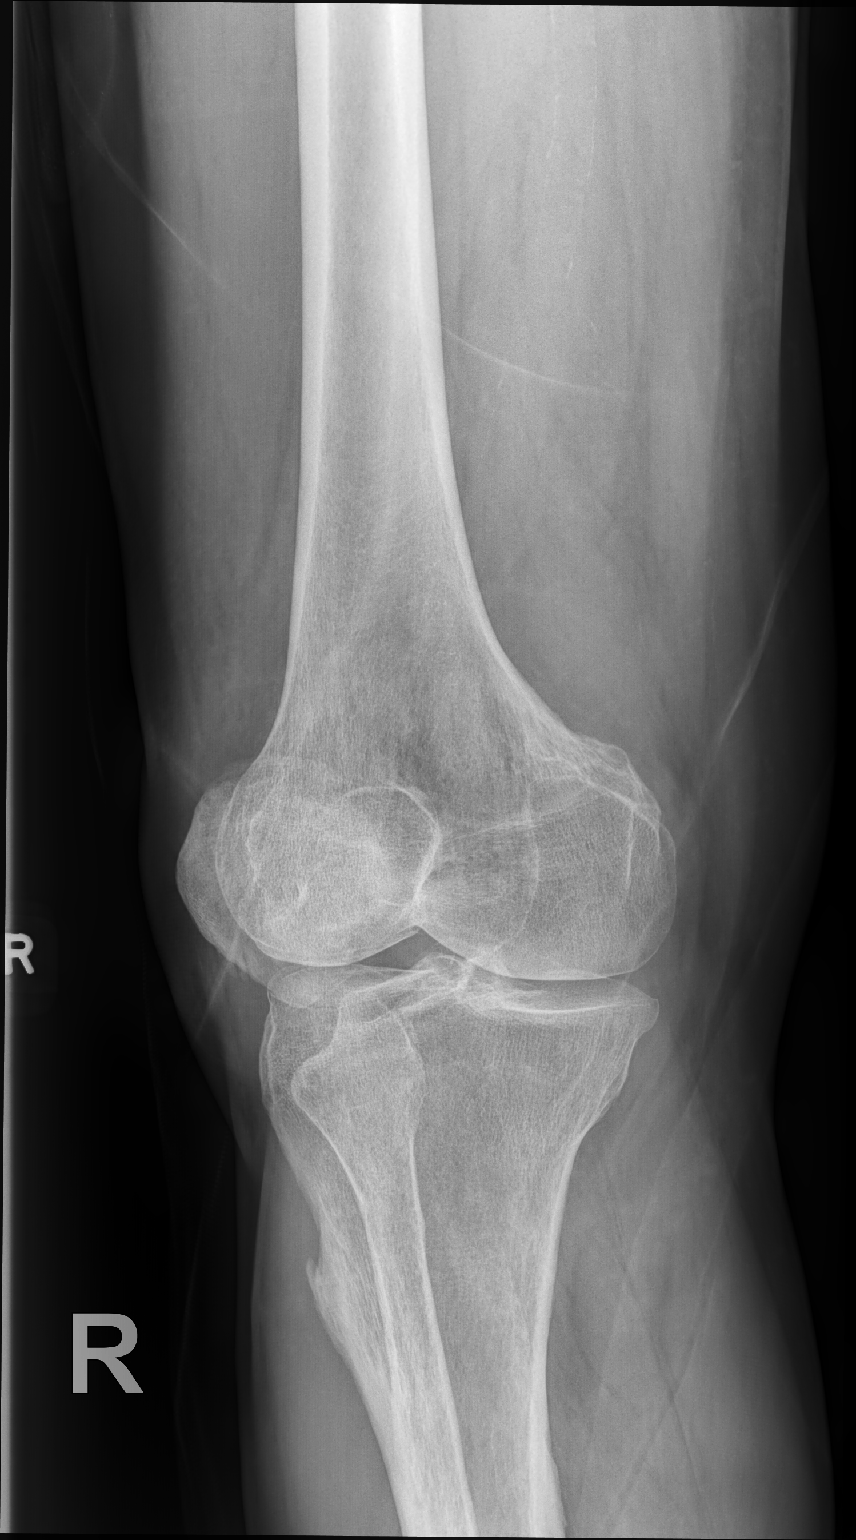

[knee lat]
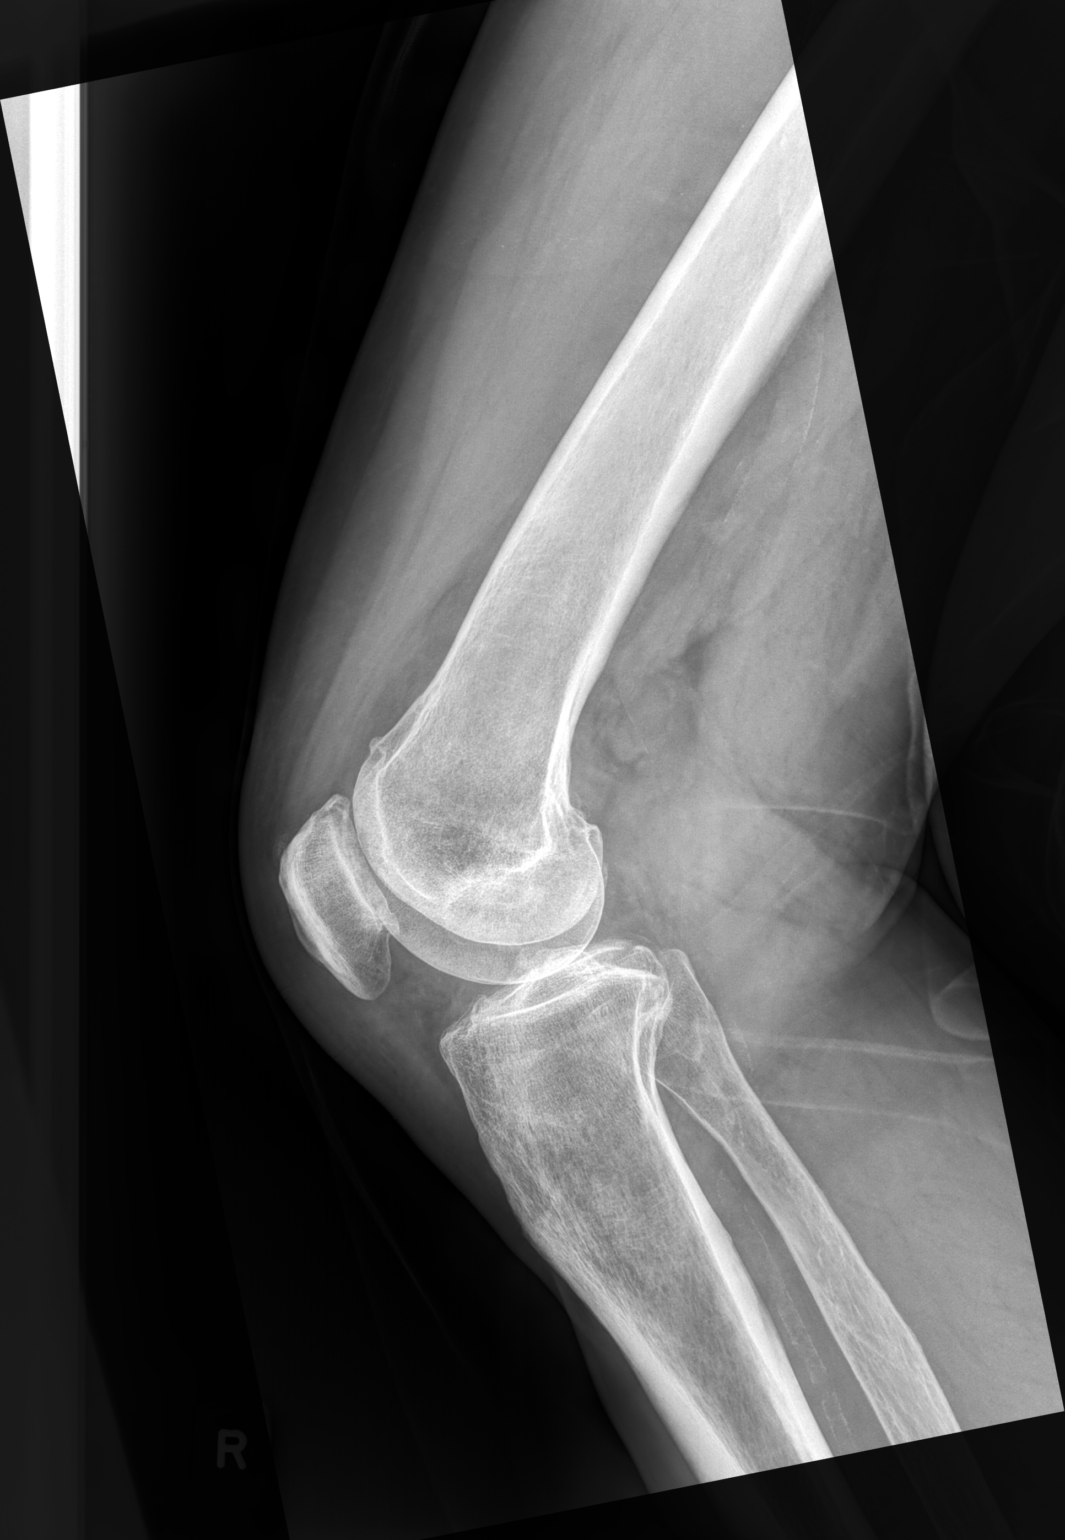

[4 of 4 positions shown; findings below may reference images not displayed]

FINDINGS: The mineralization and alignment are normal. There is no evidence of
acute fracture or dislocation. Mild tricompartmental degenerative
changes are greatest within the patellofemoral compartment. No
erosive changes or significant joint effusion.
IMPRESSION: Mild tricompartmental degenerative changes, greatest within the
patellofemoral compartment.

## 2021-12-08 ENCOUNTER — Other Ambulatory Visit: Payer: Self-pay | Admitting: Physician Assistant

## 2022-05-23 ENCOUNTER — Ambulatory Visit
Admission: EM | Admit: 2022-05-23 | Discharge: 2022-05-23 | Disposition: A | Discharge status: Home / Self Care | Payer: Medicare HMO | Attending: Family Medicine | Admitting: Family Medicine

## 2022-05-23 DIAGNOSIS — M549 Dorsalgia, unspecified: Secondary | ICD-10-CM

## 2022-05-23 MED ORDER — TIZANIDINE HCL 4 MG PO CAPS: 4.0000 mg | ORAL_CAPSULE | Freq: Three times a day (TID) | ORAL | 0 refills | Status: DC | PRN

## 2022-05-23 MED ORDER — TRAMADOL HCL 50 MG PO TABS
50.0000 mg | ORAL_TABLET | Freq: Two times a day (BID) | ORAL | 0 refills | Status: AC | PRN
Start: 2022-05-23 — End: ?

## 2022-05-23 NOTE — ED Provider Notes (Signed)
[facility]    CSN: [id number] Arrival date & time: 05/23/22  1009      History   Chief Complaint Chief Complaint  Patient presents with   Shoulder Pain    Right shoulder pain x2 weeks    HPI Stanley Long is a 66 y.o. male.   Patient presenting today with 2-week history of right upper back pain, soreness, stiffness after falling backwards and hitting the area on a trailer light.  He states everything is moving fine and he is having no difficulty breathing, chest pain, shortness of breath, bruising or skin injury to the area.  Has been trying ibuprofen which takes the edge off of the pain and lidocaine patches, topical rubs with mild temporary relief.  Denies numbness, tingling, weakness down the arm or midline spinal pain.    Past Medical History:  Diagnosis Date   Diabetes mellitus without complication (HCC)    GERD (gastroesophageal reflux disease)    Hypercholesteremia    Hypertension     Patient Active Problem List   Diagnosis Date Noted   IBS (irritable bowel syndrome) 06/20/2020   S/P right knee arthroscopy 02/13/20  02/21/2020   Meniscus, lateral, derangement, right     Past Surgical History:  Procedure Laterality Date   ABDOMINOPLASTY     amputation great toe Left    BACK SURGERY     KNEE ARTHROSCOPY WITH LATERAL MENISECTOMY Right 02/13/2020   Procedure: KNEE ARTHROSCOPY WITH LATERAL MENISCECTOMY Right;  Surgeon: Vickki Hearing, MD;  Location: [facility];  Service: Orthopedics;  Laterality: Right;   SPINE SURGERY     cyst removal       Home Medications    Prior to Admission medications   Medication Sig Start Date End Date Taking? Authorizing Provider  aspirin EC 81 MG tablet Take 81 mg by mouth daily. Swallow whole.   Yes [provider]  atorvastatin (LIPITOR) 10 MG tablet Take 1 tablet (10 mg total) by mouth daily. 01/14/21  Yes Jacquelin Hawking, PA-C  Cinnamon 500 MG TABS Take 500 mg by mouth in the morning and at bedtime.    Yes [provider]  ERGOCALCIFEROL PO Take 1 tablet by mouth daily.   Yes [provider]  glipiZIDE (GLUCOTROL) 10 MG tablet Take 1 tablet (10 mg total) by mouth 2 (two) times daily before a meal. 01/14/21  Yes Jacquelin Hawking, PA-C  HYDROcodone-acetaminophen (NORCO/VICODIN) 5-325 MG tablet Take 1 tablet by mouth every 6 (six) hours as needed for severe pain. 08/27/21  Yes Wallis Bamberg, PA-C  ibuprofen (ADVIL) 400 MG tablet Take 1 tablet (400 mg total) by mouth every 6 (six) hours as needed. 08/27/21  Yes Wallis Bamberg, PA-C  losartan (COZAAR) 100 MG tablet Take 1 tablet (100 mg total) by mouth daily. 01/14/21  Yes Jacquelin Hawking, PA-C  metFORMIN (GLUCOPHAGE) 1000 MG tablet Take 1 tablet (1,000 mg total) by mouth 2 (two) times daily with a meal. 01/14/21  Yes Jacquelin Hawking, PA-C  Multiple Vitamin (MULTIVITAMIN) capsule Take 1 capsule by mouth daily.   Yes [provider]  Omega-3 Fatty Acids (FISH OIL) 1000 MG CAPS Take 1 capsule by mouth in the morning and at bedtime.   Yes [provider]  omeprazole (PRILOSEC) 20 MG capsule Take 1 capsule (20 mg total) by mouth daily. 01/14/21  Yes Jacquelin Hawking, PA-C  tiZANidine (ZANAFLEX) 4 MG capsule Take 1 capsule (4 mg total) by mouth 3 (three) times daily as needed for muscle spasms. Do  not drink alcohol or drive while taking this medication.  May cause drowsiness. 05/23/22  Yes Volney American, PA-C  traMADol (ULTRAM) 50 MG tablet Take 1 tablet (50 mg total) by mouth every 12 (twelve) hours as needed. 05/23/22  Yes Volney American, PA-C  vitamin C (ASCORBIC ACID) 250 MG tablet Take 250 mg by mouth 2 (two) times daily.   Yes [provider]  vitamin E 1000 UNIT capsule Take 1,000 Units by mouth daily.   Yes [provider]    Family History Family History  Problem Relation Age of Onset   Diabetes Mother    Hypertension Mother    Stroke Mother    Diabetes Father    Heart disease Father      Social History Social History   Tobacco Use   Smoking status: Never   Smokeless tobacco: Never  Vaping Use   Vaping Use: Never used  Substance Use Topics   Alcohol use: Not Currently   Drug use: Never     Allergies   Patient has no known allergies.   Review of Systems Review of Systems Per HPI  Physical Exam Triage Vital Signs ED Triage Vitals  Enc Vitals Group     BP 05/23/22 1116 135/80     Pulse Rate 05/23/22 1116 70     Resp 05/23/22 1116 16     Temp 05/23/22 1116 (!) 97.5 F (36.4 C)     Temp src --      SpO2 05/23/22 1116 98 %     Weight 05/23/22 1114 212 lb (96.2 kg)     Height 05/23/22 1114 5\' 8"  (1.727 m)     Head Circumference --      Peak Flow --      Pain Score 05/23/22 1114 6     Pain Loc --      Pain Edu? --      Excl. in [place]? --    No data found.  Updated Vital Signs BP 135/80 (BP Location: Right Arm)   Pulse 70   Temp (!) 97.5 F (36.4 C)   Resp 16   Ht 5\' 8"  (1.727 m)   Wt 212 lb (96.2 kg)   SpO2 98%   BMI 32.23 kg/m   Visual Acuity Right Eye Distance:   Left Eye Distance:   Bilateral Distance:    Right Eye Near:   Left Eye Near:    Bilateral Near:     Physical Exam Vitals and nursing note reviewed.  Constitutional:      Appearance: Normal appearance.  HENT:     Head: Atraumatic.  Eyes:     Extraocular Movements: Extraocular movements intact.     Conjunctiva/sclera: Conjunctivae normal.     Pupils: Pupils are equal, round, and reactive to light.  Cardiovascular:     Rate and Rhythm: Normal rate and regular rhythm.  Pulmonary:     Effort: Pulmonary effort is normal.     Breath sounds: Normal breath sounds. No wheezing.     Comments: Chest rise symmetric bilaterally Musculoskeletal:        General: Tenderness present. No deformity. Normal range of motion.     Cervical back: Normal range of motion and neck supple.     Comments: Right periscapular region tender to palpation without bony deformity palpable.  Range  of motion of the right upper extremity fully intact.  Grip strength full and equal  Skin:    General: Skin is warm and dry.  Neurological:     General: No focal deficit present.     Mental Status: He is oriented to person, place, and time.     Comments: Right upper extremity neurovascularly intact  Psychiatric:        Mood and Affect: Mood normal.        Thought Content: Thought content normal.        Judgment: Judgment normal.      UC Treatments / Results  Labs (all labs ordered are listed, but only abnormal results are displayed) Labs Reviewed - No data to display  EKG   Radiology No results found.  Procedures Procedures (including critical care time)  Medications Ordered in UC Medications - No data to display  Initial Impression / Assessment and Plan / UC Course  I have reviewed the triage vital signs and the nursing notes.  Pertinent labs & imaging results that were available during my care of the patient were reviewed by me and considered in my medical decision making (see chart for details).     No evidence of bony injury today so shared decision making used to defer x-ray imaging.  We will treat with Zanaflex, small supply of tramadol for significant pain while area heals, continued over-the-counter anti-inflammatories and supportive home care.  Return for any worsening symptoms.  Final Clinical Impressions(s) / UC Diagnoses   Final diagnoses:  Upper back pain on right side   Discharge Instructions   None    ED Prescriptions     Medication Sig Dispense Auth. Provider   tiZANidine (ZANAFLEX) 4 MG capsule Take 1 capsule (4 mg total) by mouth 3 (three) times daily as needed for muscle spasms. Do not drink alcohol or drive while taking this medication.  May cause drowsiness. 15 capsule Particia Nearing, [place]   traMADol (ULTRAM) 50 MG tablet Take 1 tablet (50 mg total) by mouth every 12 (twelve) hours as needed. 10 tablet Particia Nearing, [place]       I have reviewed the PDMP during this encounter.   Particia Nearing, [place] 05/23/22 1202

## 2022-05-23 NOTE — ED Triage Notes (Signed)
Pt states that he fell and hit his right shoulder. X2 weeks

## 2023-01-06 ENCOUNTER — Other Ambulatory Visit: Payer: Self-pay | Admitting: *Deleted

## 2023-01-06 DIAGNOSIS — R222 Localized swelling, mass and lump, trunk: Secondary | ICD-10-CM

## 2023-01-12 ENCOUNTER — Other Ambulatory Visit: Payer: Self-pay | Admitting: Family Medicine

## 2023-01-12 ENCOUNTER — Encounter: Payer: Self-pay | Admitting: Surgery

## 2023-01-12 ENCOUNTER — Ambulatory Visit: Payer: Medicare HMO | Admitting: Surgery

## 2023-01-12 VITALS — BP 139/71 | HR 62 | Temp 97.6°F | Resp 16 | Ht 68.0 in | Wt 218.0 lb

## 2023-01-12 DIAGNOSIS — L02212 Cutaneous abscess of back [any part, except buttock]: Secondary | ICD-10-CM

## 2023-01-12 MED ORDER — SULFAMETHOXAZOLE-TRIMETHOPRIM 800-160 MG PO TABS: 1.0000 | ORAL_TABLET | Freq: Two times a day (BID) | ORAL | 0 refills | Status: AC

## 2023-01-12 NOTE — Progress Notes (Signed)
[facility] History and Physical  Reason for Referral: Back mass Referring Physician: Colleen Can, NP  Chief Complaint   New Patient (Initial Visit)     Stanley Long is a 67 y.o. male.  HPI: Patient presents for evaluation of a knot on his back.  The area has been present for 2 to 3 months.  He denies significant drainage associated with the area.  He does have pain associated with it with rubbing.  He denies any fevers or chills.  Denies taking any antibiotics for this area.  He does have a history of a cyst that was removed from his back many years ago.  He denies any previous history of incision and drainage.  He has a past medical history significant for diabetes.  He denies history of heart or lung disease.  He takes an 81 mg aspirin daily.  He denies use of tobacco products, alcohol or illicit drugs.  Past Medical History:  Diagnosis Date   Diabetes mellitus without complication (HCC)    GERD (gastroesophageal reflux disease)    Hypercholesteremia    Hypertension     Past Surgical History:  Procedure Laterality Date   ABDOMINOPLASTY     amputation great toe Left    BACK SURGERY     KNEE ARTHROSCOPY WITH LATERAL MENISECTOMY Right 02/13/2020   Procedure: KNEE ARTHROSCOPY WITH LATERAL MENISCECTOMY Right;  Surgeon: Vickki Hearing, MD;  Location: [facility];  Service: Orthopedics;  Laterality: Right;   SPINE SURGERY     cyst removal    Family History  Problem Relation Age of Onset   Diabetes Mother    Hypertension Mother    Stroke Mother    Diabetes Father    Heart disease Father     Social History   Tobacco Use   Smoking status: Never   Smokeless tobacco: Never  Vaping Use   Vaping Use: Never used  Substance Use Topics   Alcohol use: Not Currently   Drug use: Never    Medications: I have reviewed the patient's current medications. Allergies as of 01/12/2023   No Known Allergies      Medication List        Accurate as of Jan 12, 2023 10:36 AM. If you have any questions, ask your nurse or doctor.          STOP taking these medications    losartan 100 MG tablet Commonly known as: COZAAR Stopped by: Yaeko Fazekas A Rhondalyn Clingan, DO   omeprazole 20 MG capsule Commonly known as: PRILOSEC Stopped by: Salote Weidmann A Tajh Livsey, DO       TAKE these medications    aspirin EC 81 MG tablet Take 81 mg by mouth daily. Swallow whole.   atorvastatin 10 MG tablet Commonly known as: LIPITOR Take 1 tablet (10 mg total) by mouth daily.   Cinnamon 500 MG Tabs Take 500 mg by mouth in the morning and at bedtime.   ERGOCALCIFEROL PO Take 1 tablet by mouth daily.   Fish Oil 1000 MG Caps Take 1 capsule by mouth in the morning and at bedtime.   glipiZIDE 10 MG tablet Commonly known as: GLUCOTROL Take 1 tablet (10 mg total) by mouth 2 (two) times daily before a meal.   HYDROcodone-acetaminophen 5-325 MG tablet Commonly known as: NORCO/VICODIN Take 1 tablet by mouth every 6 (six) hours as needed for severe pain.   ibuprofen 400 MG tablet Commonly known as: ADVIL Take 1 tablet (400 mg total) by mouth every 6 (  six) hours as needed.   lisinopril 10 MG tablet Commonly known as: ZESTRIL Take 10 mg by mouth daily.   loratadine 10 MG tablet Commonly known as: CLARITIN Take 10 mg by mouth daily.   metFORMIN 1000 MG tablet Commonly known as: GLUCOPHAGE Take 1 tablet (1,000 mg total) by mouth 2 (two) times daily with a meal.   mirtazapine 15 MG tablet Commonly known as: REMERON Take 15 mg by mouth at bedtime.   multivitamin capsule Take 1 capsule by mouth daily.   pantoprazole 20 MG tablet Commonly known as: PROTONIX Take 20 mg by mouth daily.   tiZANidine 4 MG capsule Commonly known as: Zanaflex Take 1 capsule (4 mg total) by mouth 3 (three) times daily as needed for muscle spasms. Do not drink alcohol or drive while taking this medication.  May cause drowsiness.   traMADol 50 MG tablet Commonly known as:  ULTRAM Take 1 tablet (50 mg total) by mouth every 12 (twelve) hours as needed.   vitamin C 250 MG tablet Commonly known as: ASCORBIC ACID Take 250 mg by mouth 2 (two) times daily.   vitamin E 1000 UNIT capsule Take 1,000 Units by mouth daily.         ROS:  Constitutional: negative for chills, fatigue, and fevers Eyes: positive for visual disturbance, negative for pain Ears, nose, mouth, throat, and face: positive for sinus problems, negative for ear drainage and sore throat Respiratory: positive for cough and wheezing, negative for shortness of breath Cardiovascular: negative for chest pain and palpitations Gastrointestinal: positive for reflux symptoms, negative for abdominal pain, nausea, and vomiting Genitourinary:negative for dysuria and frequency Integument/breast: negative for dryness and rash Hematologic/lymphatic: negative for bleeding and lymphadenopathy Musculoskeletal:positive for neck pain, negative for back pain Neurological: negative for dizziness and tremors Endocrine: negative for temperature intolerance  Blood pressure 139/71, pulse 62, temperature 97.6 F (36.4 C), temperature source Oral, resp. rate 16, height 5\' 8"  (1.727 m), weight 218 lb (98.9 kg), SpO2 93 %. Physical Exam Vitals reviewed.  Constitutional:      Appearance: Normal appearance.  HENT:     Head: Normocephalic and atraumatic.  Eyes:     Extraocular Movements: Extraocular movements intact.     Pupils: Pupils are equal, round, and reactive to light.  Cardiovascular:     Rate and Rhythm: Normal rate and regular rhythm.  Pulmonary:     Effort: Pulmonary effort is normal.     Breath sounds: Normal breath sounds.  Abdominal:     General: There is no distension.     Palpations: Abdomen is soft.     Tenderness: There is no abdominal tenderness.  Musculoskeletal:        General: Normal range of motion.     Cervical back: Normal range of motion.  Skin:    Comments: 3 to 4 cm cyst in the  midline mid back with associated induration and erythema, minimally tender to palpation, central area of fluctuance noted  Neurological:     General: No focal deficit present.     Mental Status: He is alert and oriented to person, place, and time.  Psychiatric:        Mood and Affect: Mood normal.        Behavior: Behavior normal.     Results: No results found for this or any previous visit (from the past 48 hour(s)).  No results found.   Assessment & Plan:  Stanley Long is a 67 y.o. male who presents for evaluation  of back mass.  -I explained to the patient that it appears he has a cyst to his mid back which looks infected.  His options would be to give antibiotics alone and see how this area progresses or give antibiotics and perform incision and drainage.   -The risk and benefits of back abscess incision and drainage were discussed including but not limited to bleeding, infection, injury to surrounding structures, need for additional procedures.  After careful consideration, Stanley Long has decided to proceed with incision and drainage. -Please refer to details of procedure below -Advised that he should hold his aspirin for the next 2 to 3 days -Prescription for Bactrim for 7 days provided to the patient.   -Advised that he should pack the area with iodoform gauze.  Patient will see if his daughter who lives across the street can assist him with dressing changes.  Advised that he can come to the office during the week for assistance with dressing changes if needed -He will follow-up with my partner, Dr. Henreitta Leber, next week to verify the area is improving  All questions were answered to the satisfaction of the patient.  Bedside procedure note   Preoperative Diagnosis: Back abscess Postoperative Diagnosis: Back abscess   Procedure(s) Performed: Incision and drainage of back abscess   Performing provider: Theophilus Kinds, DO   Estimated Blood Loss: Minimal   Findings:  Infected sebaceous cyst of the back   Procedure: At bedside, back abscess was prepped with Betadine and draped.  Verbal and written consent obtained from the patient prior to beginning of procedure.  Using 1% lidocaine with epinephrine, the area overlying the abscess was localized.  Using scalpel, a linear incision was made over top of the abscess, there was a moderate amount of purulent drainage.  Wound culture was obtained.  The cavity was then probed for any loculations.  It was then copiously irrigated with saline.  Hemostasis was noted.  The cavity was then packed with half-inch iodoform gauze.  It was dressed with a pressure dressing of gauze and Medipore tape.  Patient tolerated the procedure without issue.   Theophilus Kinds, DO [facility] [address], [place] [address] [phone] (office)

## 2023-01-12 NOTE — Patient Instructions (Addendum)
Wound Care Instructions:  -Hold your aspirin for the next 2-3 days -Remove the packing tomorrow -May shower starting tomorrow.  Let soapy water run over top of the incision and pat dry -Pack with gauze in brown container.  Use a Q-tip and back the gauze within the wound -Cover with gauze -Come to the office to assist with dressing changes if you are having difficulty -If area worsens or you begin to have fever/chills, present to the ED

## 2023-01-15 LAB — WOUND CULTURE
MICRO NUMBER:: 15009770
SPECIMEN QUALITY:: ADEQUATE

## 2023-01-21 ENCOUNTER — Ambulatory Visit (INDEPENDENT_AMBULATORY_CARE_PROVIDER_SITE_OTHER): Payer: Medicare HMO | Admitting: General Surgery

## 2023-01-21 ENCOUNTER — Encounter: Payer: Self-pay | Admitting: General Surgery

## 2023-01-21 VITALS — BP 145/76 | HR 65 | Temp 97.8°F | Resp 18 | Ht 68.0 in | Wt 219.0 lb

## 2023-01-21 DIAGNOSIS — L02212 Cutaneous abscess of back [any part, except buttock]: Secondary | ICD-10-CM

## 2023-01-21 NOTE — Progress Notes (Signed)
[facility]  Patinet with back abscess. His daughter changed it over the weekend but he has been coming to clinic to get it changed daily.  No complaints. Cultures without organisms.   BP (!) 145/76   Pulse 65   Temp 97.8 F (36.6 C) (Oral)   Resp 18   Ht 5\' 8"  (1.727 m)   Wt 219 lb (99.3 kg)   SpO2 96%   BMI 33.30 kg/m  Back incision clean, no erythema and packing with minimal drainage, repacked and bandaged   Patient s/p infected sebaceous cyst excision. Doing well. Packing challenges as he only has his daughter available on the weekend.  Clinic unfortunately cannot do it tomorrow. He will get his daughter to pack it tomorrow and weekend. Told him if he misses a day it will be ok but to pack it the next day.  Continue to pack daily.  Ok to shower.  Pack after showering.   Future Appointments  Date Time Provider Department Center  01/27/2023  3:45 PM Pappayliou, Gustavus Messing, DO [facility] None   Algis Greenhouse, MD [facility] [address] [place], [place] [address] [phone] (office)

## 2023-01-21 NOTE — Patient Instructions (Signed)
Continue to pack daily.  Ok to shower.  Pack after showering.

## 2023-01-26 LAB — HEMOGLOBIN A1C: Hemoglobin A1C: 10

## 2023-01-27 ENCOUNTER — Ambulatory Visit (INDEPENDENT_AMBULATORY_CARE_PROVIDER_SITE_OTHER): Payer: Medicare HMO | Admitting: Surgery

## 2023-01-27 ENCOUNTER — Encounter: Payer: Self-pay | Admitting: Surgery

## 2023-01-27 VITALS — BP 117/67 | HR 83 | Temp 98.0°F | Resp 14 | Ht 68.0 in | Wt 217.0 lb

## 2023-01-27 DIAGNOSIS — L02212 Cutaneous abscess of back [any part, except buttock]: Secondary | ICD-10-CM | POA: Diagnosis not present

## 2023-01-27 NOTE — Progress Notes (Signed)
[facility]   HPI:  67 y.o. Male presents to clinic for post-op follow-up status post incision and drainage of back abscess on 5/28.  He has been coming to the office almost daily for packing changes.  He denies any significant drainage or pain from this area.  He denies any fevers or chills.  He did complete his course of antibiotics.  He has no complaints at this time.  Review of Systems:  All other review of systems: otherwise negative   Vital Signs:  BP 117/67   Pulse 83   Temp 98 F (36.7 C) (Oral)   Resp 14   Ht 5\' 8"  (1.727 m)   Wt 217 lb (98.4 kg)   SpO2 96%   BMI 32.99 kg/m    Physical Exam:  Physical Exam Vitals reviewed.  Constitutional:      Appearance: Normal appearance.  Skin:    Comments: Mid back incision and drainage site healing well with packing in place, pink granulation tissue at the base, with no significant bloody or purulent drainage  Neurological:     Mental Status: He is alert.     Laboratory studies: None  Imaging:  None  Assessment:  67 y.o. yo Male who presents for follow-up status post incision and drainage of back abscess on 5/28  Plan:  -Patient has overall been doing well and office staff has been assisting him with daily dressing changes -Advised to continue with packing changes until the wound is small enough that packing will no longer stay in place -Follow-up with me in 2 weeks   All of the above recommendations were discussed with the patient, and all of patient's questions were answered to his expressed satisfaction.  Theophilus Kinds, DO [facility] [address] [place], [place] [address] [phone] (office)

## 2023-02-04 ENCOUNTER — Ambulatory Visit (INDEPENDENT_AMBULATORY_CARE_PROVIDER_SITE_OTHER): Payer: Medicare HMO | Admitting: Surgery

## 2023-02-04 DIAGNOSIS — L02212 Cutaneous abscess of back [any part, except buttock]: Secondary | ICD-10-CM

## 2023-02-05 NOTE — Progress Notes (Signed)
[facility] Note   HPI:  67 y.o. Male presents to clinic for follow-up status post incision and drainage of back abscess on 5/28.  He has been coming to the office for dressing changes, and the area is doing well.  He denies significant drainage or pain from this area.  Denies fevers and chills.  Review of Systems:  All other review of systems: otherwise negative   Vital Signs:  There were no vitals taken for this visit.   Physical Exam:  Physical Exam Vitals reviewed.  Constitutional:      Appearance: Normal appearance.  Skin:    Comments: Mid back incision and drainage site healing well with minimal surrounding induration, no erythema, pink granulation tissue at the base  Neurological:     Mental Status: He is alert.     Laboratory studies: None  Imaging:  None  Assessment:  67 y.o. yo Male who presents for follow-up status post incision and drainage of back abscess on 5/28  Plan:  -Patient doing very well -Advised that he no longer needs to pack his incision and drainage site.  Keep the area covered.  May shower like normal -Advised that he should not put anything on the wound at this time -Follow up with me in 1 week  All of the above recommendations were discussed with the patient, and all of patient's questions were answered to his r expressed satisfaction.  Theophilus Kinds, DO [facility] [address] [place], [place] [address] [phone] (office)

## 2023-02-09 ENCOUNTER — Encounter: Payer: Self-pay | Admitting: Surgery

## 2023-02-09 ENCOUNTER — Ambulatory Visit (INDEPENDENT_AMBULATORY_CARE_PROVIDER_SITE_OTHER): Payer: Medicare HMO | Admitting: Surgery

## 2023-02-09 VITALS — BP 126/73 | HR 65 | Temp 97.6°F | Resp 16 | Ht 68.0 in | Wt 218.0 lb

## 2023-02-09 DIAGNOSIS — L02212 Cutaneous abscess of back [any part, except buttock]: Secondary | ICD-10-CM | POA: Diagnosis not present

## 2023-02-11 NOTE — Progress Notes (Signed)
[facility] Note   HPI:  67 y.o. Male presents to clinic for follow-up evaluation of status post incision and drainage of back abscess on 5/28.  He has been doing well since being seen in the office last week.  He denies any significant drainage from the area.  Denies any significant pain.  Denies fevers and chills.  Review of Systems:  All other review of systems: otherwise negative   Vital Signs:  BP 126/73   Pulse 65   Temp 97.6 F (36.4 C) (Oral)   Resp 16   Ht 5\' 8"  (1.727 m)   Wt 218 lb (98.9 kg)   SpO2 96%   BMI 33.15 kg/m    Physical Exam:  Physical Exam Vitals reviewed.  Constitutional:      Appearance: Normal appearance.  Skin:    Comments: Mid back incision and drainage site healing well with minimal induration, no erythema, small less than 1 cm opening with pink tissue at the base  Neurological:     Mental Status: He is alert.     Laboratory studies: None   Imaging:  None   Assessment:  67 y.o. yo Male who presents for follow-up status post incision and drainage of back abscess on 5/28  Plan:  -Patient overall doing well and area continues to improve. -Advised that he does not need to put antibiotic ointment or any other creams on the area.  He should keep the area covered for an additional week or 2 until the opening is fully closed -Follow up with me in 1 month  All of the above recommendations were discussed with the patient, and all of patient's questions were answered to his expressed satisfaction.  Theophilus Kinds, DO [facility] [address] [place], [place] [address] [phone] (office)

## 2023-02-22 ENCOUNTER — Telehealth: Payer: Self-pay | Admitting: *Deleted

## 2023-02-22 NOTE — Telephone Encounter (Signed)
Surgical Date: 01/12/2023 Procedure: Incision and drainage of back abscess   Patient came into office with concerns of something growing out of prior incision. Assessed area and noted pea size hypergranulation tissue to mid incision that is preventing incision from healing.   Gently applied silver nitrate to hypergranulation tissue. Advised to return to office if area does not improve.

## 2023-03-16 ENCOUNTER — Encounter: Payer: Medicare HMO | Admitting: Surgery

## 2023-03-17 ENCOUNTER — Telehealth: Payer: Self-pay | Admitting: *Deleted

## 2023-03-17 NOTE — Telephone Encounter (Signed)
Surgical Date: 01/12/2023 Procedure: Incision and drainage of back abscess    Patient came into office for follow up of back abscess I&D. Patient states that he missed appointment on 03/17/2023.  Assessed area and noted that incision has healed. Noted that no further hypergranulation of tissue present.   Advised to contact our office as needed.

## 2023-05-05 ENCOUNTER — Other Ambulatory Visit (HOSPITAL_COMMUNITY): Payer: Self-pay | Admitting: Adult Health

## 2023-05-05 DIAGNOSIS — M542 Cervicalgia: Secondary | ICD-10-CM

## 2023-05-07 ENCOUNTER — Ambulatory Visit (HOSPITAL_COMMUNITY)
Admission: RE | Admit: 2023-05-07 | Discharge: 2023-05-07 | Disposition: A | Discharge status: Home / Self Care | Payer: Medicare HMO | Source: Ambulatory Visit | Attending: Adult Health | Admitting: Adult Health

## 2023-05-07 DIAGNOSIS — M542 Cervicalgia: Secondary | ICD-10-CM | POA: Diagnosis present

## 2023-08-27 LAB — HEMOGLOBIN A1C: Hemoglobin A1C: 8.7

## 2023-09-02 ENCOUNTER — Other Ambulatory Visit: Payer: Self-pay

## 2023-09-02 ENCOUNTER — Encounter: Payer: Self-pay | Admitting: Emergency Medicine

## 2023-09-02 ENCOUNTER — Ambulatory Visit
Admission: EM | Admit: 2023-09-02 | Discharge: 2023-09-02 | Disposition: A | Discharge status: Home / Self Care | Payer: Medicare HMO

## 2023-09-02 DIAGNOSIS — K625 Hemorrhage of anus and rectum: Secondary | ICD-10-CM

## 2023-09-02 MED ORDER — HYDROCORTISONE ACETATE 25 MG RE SUPP: 25.0000 mg | Freq: Two times a day (BID) | RECTAL | 0 refills | Status: AC | PRN

## 2023-09-02 NOTE — ED Provider Notes (Signed)
[facility]    CSN: [id number] Arrival date & time: 09/02/23  1008      History   Chief Complaint Chief Complaint  Patient presents with   Rectal Bleeding    HPI Stanley Long is a 68 y.o. male.   Patient presenting today with 1 day history of semidark red blood with a bowel movement last night and this morning.  Has also had some breakthrough bleeding in between bowel movements that has staining his underwear per patient.  Denies anorectal pain, abdominal pain, fevers, chills, nausea, vomiting, lightheadedness, chest pain, shortness of breath, history of known lower GI issues.  States he has had a colonoscopy in the past but not for many years and is overdue per patient.  So far not tried anything over-the-counter for symptoms.  Only new medication change recently as he states he just took his second dose of Trulicity.  Denies being on any anticoagulation apart from a baby aspirin daily.    Past Medical History:  Diagnosis Date   Diabetes mellitus without complication (HCC)    GERD (gastroesophageal reflux disease)    Hypercholesteremia    Hypertension     Patient Active Problem List   Diagnosis Date Noted   IBS (irritable bowel syndrome) 06/20/2020   S/P right knee arthroscopy 02/13/20  02/21/2020   Meniscus, lateral, derangement, right     Past Surgical History:  Procedure Laterality Date   ABDOMINOPLASTY     amputation great toe Left    BACK SURGERY     KNEE ARTHROSCOPY WITH LATERAL MENISECTOMY Right 02/13/2020   Procedure: KNEE ARTHROSCOPY WITH LATERAL MENISCECTOMY Right;  Surgeon: Vickki Hearing, MD;  Location: [facility];  Service: Orthopedics;  Laterality: Right;   SPINE SURGERY     cyst removal       Home Medications    Prior to Admission medications   Medication Sig Start Date End Date Taking? Authorizing Provider  hydrocortisone (ANUSOL-HC) 25 MG suppository Place 1 suppository (25 mg total) rectally 2 (two) times daily as needed for  hemorrhoids or anal itching. 09/02/23  Yes Particia Nearing, PA-C  TRULICITY 1.5 MG/0.5ML SOAJ Inject 0.75 mg into the skin once a week. 08/12/23  Yes [provider]  aspirin EC 81 MG tablet Take 81 mg by mouth daily. Swallow whole.    [provider]  atorvastatin (LIPITOR) 10 MG tablet Take 1 tablet (10 mg total) by mouth daily. 01/14/21   Jacquelin Hawking, PA-C  Cinnamon 500 MG TABS Take 500 mg by mouth in the morning and at bedtime. Patient not taking: Reported on 01/27/2023    [provider]  ERGOCALCIFEROL PO Take 1 tablet by mouth daily.    [provider]  glipiZIDE (GLUCOTROL) 10 MG tablet Take 1 tablet (10 mg total) by mouth 2 (two) times daily before a meal. 01/14/21   Jacquelin Hawking, PA-C  HYDROcodone-acetaminophen (NORCO/VICODIN) 5-325 MG tablet Take 1 tablet by mouth every 6 (six) hours as needed for severe pain. Patient not taking: Reported on 01/12/2023 08/27/21   Wallis Bamberg, PA-C  ibuprofen (ADVIL) 400 MG tablet Take 1 tablet (400 mg total) by mouth every 6 (six) hours as needed. 08/27/21   Wallis Bamberg, PA-C  lisinopril (ZESTRIL) 10 MG tablet Take 10 mg by mouth daily. 12/04/22   [provider]  loratadine (CLARITIN) 10 MG tablet Take 10 mg by mouth daily. 12/30/22   [provider]  metFORMIN (GLUCOPHAGE) 1000 MG tablet Take 1 tablet (1,000 mg  total) by mouth 2 (two) times daily with a meal. 01/14/21   Jacquelin Hawking, PA-C  mirtazapine (REMERON) 15 MG tablet Take 15 mg by mouth at bedtime. 12/31/22   [provider]  Multiple Vitamin (MULTIVITAMIN) capsule Take 1 capsule by mouth daily.    [provider]  Omega-3 Fatty Acids (FISH OIL) 1000 MG CAPS Take 1 capsule by mouth in the morning and at bedtime.    [provider]  pantoprazole (PROTONIX) 20 MG tablet Take 20 mg by mouth daily. 12/31/22   [provider]  tiZANidine (ZANAFLEX) 4 MG capsule Take 1 capsule (4 mg total) by mouth 3  (three) times daily as needed for muscle spasms. Do not drink alcohol or drive while taking this medication.  May cause drowsiness. 05/23/22   Particia Nearing, PA-C  traMADol (ULTRAM) 50 MG tablet Take 1 tablet (50 mg total) by mouth every 12 (twelve) hours as needed. 05/23/22   Particia Nearing, PA-C  vitamin C (ASCORBIC ACID) 250 MG tablet Take 250 mg by mouth 2 (two) times daily.    [provider]  vitamin E 1000 UNIT capsule Take 1,000 Units by mouth daily. Patient not taking: Reported on 01/27/2023    [provider]    Family History Family History  Problem Relation Age of Onset   Diabetes Mother    Hypertension Mother    Stroke Mother    Diabetes Father    Heart disease Father     Social History Social History   Tobacco Use   Smoking status: Never   Smokeless tobacco: Never  Vaping Use   Vaping status: Never Used  Substance Use Topics   Alcohol use: Not Currently   Drug use: Never     Allergies   Patient has no known allergies.   Review of Systems Review of Systems PER HPI  Physical Exam Triage Vital Signs ED Triage Vitals  Encounter Vitals Group     BP 09/02/23 1059 (!) 145/86     Systolic BP Percentile --      Diastolic BP Percentile --      Pulse Rate 09/02/23 1059 80     Resp 09/02/23 1059 20     Temp 09/02/23 1059 98.1 F (36.7 C)     Temp Source 09/02/23 1059 Oral     SpO2 09/02/23 1059 93 %     Weight --      Height --      Head Circumference --      Peak Flow --      Pain Score 09/02/23 1058 0     Pain Loc --      Pain Education --      Exclude from Growth Chart --    No data found.  Updated Vital Signs BP (!) 145/86 (BP Location: Right Arm)   Pulse 80   Temp 98.1 F (36.7 C) (Oral)   Resp 20   SpO2 93%   Visual Acuity Right Eye Distance:   Left Eye Distance:   Bilateral Distance:    Right Eye Near:   Left Eye Near:    Bilateral Near:     Physical Exam Vitals and nursing note reviewed. Exam  conducted with a chaperone present.  Constitutional:      Appearance: Normal appearance.  HENT:     Head: Atraumatic.  Eyes:     Extraocular Movements: Extraocular movements intact.     Conjunctiva/sclera: Conjunctivae normal.  Cardiovascular:  Rate and Rhythm: Normal rate and regular rhythm.  Pulmonary:     Effort: Pulmonary effort is normal.     Breath sounds: Normal breath sounds.  Abdominal:     General: Bowel sounds are normal. There is no distension.     Palpations: Abdomen is soft.     Tenderness: There is no abdominal tenderness. There is no guarding.  Genitourinary:    Comments: No obvious external hemorrhoids, fissures, abscesses on examination, mild inflammatory changes on internal and a rectal exam but no active bleeding.  Does have a blood stain to the underpants observed during exam, not currently bleeding. Musculoskeletal:        General: Normal range of motion.     Cervical back: Normal range of motion and neck supple.  Skin:    General: Skin is warm and dry.  Neurological:     General: No focal deficit present.     Mental Status: He is oriented to person, place, and time.  Psychiatric:        Mood and Affect: Mood normal.        Thought Content: Thought content normal.        Judgment: Judgment normal.    UC Treatments / Results  Labs (all labs ordered are listed, but only abnormal results are displayed) Labs Reviewed  CBC WITH DIFFERENTIAL/PLATELET   EKG  Radiology No results found.  Procedures Procedures (including critical care time)  Medications Ordered in UC Medications - No data to display  Initial Impression / Assessment and Plan / UC Course  I have reviewed the triage vital signs and the nursing notes.  Pertinent labs & imaging results that were available during my care of the patient were reviewed by me and considered in my medical decision making (see chart for details).     Exam overall reassuring today, however symptoms  concerning for intestinal etiology as no obvious bleeding hemorrhoids and blood is not bright red.  Will trial Anusol suppositories, fiber supplements but discussed with patient the importance of immediate follow-up with PCP or GI specialist for further evaluation as we do not have resources here to further evaluate beyond a CBC and anal exam.  CBC is pending.  ED for worsening symptoms as discussed at length with patient.  Final Clinical Impressions(s) / UC Diagnoses   Final diagnoses:  Rectal bleeding     Discharge Instructions      I have sent in Anusol suppositories to treat potential inflammation of the rectal area.  I also recommend taking a fiber supplement and making sure to stay well-hydrated.  We have obtained blood counts and will call if these come back abnormal.  It is important that you follow-up as soon as possible with your primary care provider or the Gastroenterologist for further evaluation into your symptoms.  If your symptoms worsen at any time, to include fevers, vomiting, abdominal pain, profuse bleeding that is not controlled go to the emergency department immediately    ED Prescriptions     Medication Sig Dispense Auth. Provider   hydrocortisone (ANUSOL-HC) 25 MG suppository Place 1 suppository (25 mg total) rectally 2 (two) times daily as needed for hemorrhoids or anal itching. 12 suppository Particia Nearing, [place]      PDMP not reviewed this encounter.   Particia Nearing, [place] 09/02/23 1154

## 2023-09-02 NOTE — Discharge Instructions (Addendum)
I have sent in Anusol suppositories to treat potential inflammation of the rectal area.  I also recommend taking a fiber supplement and making sure to stay well-hydrated.  We have obtained blood counts and will call if these come back abnormal.  It is important that you follow-up as soon as possible with your primary care provider or the Gastroenterologist for further evaluation into your symptoms.  If your symptoms worsen at any time, to include fevers, vomiting, abdominal pain, profuse bleeding that is not controlled go to the emergency department immediately

## 2023-09-02 NOTE — ED Triage Notes (Signed)
Pt reports dark red blood since last night with BM's. Pt reports last BM this am but reports " I haven't been able to go like I usually do."

## 2023-09-03 LAB — CBC WITH DIFFERENTIAL/PLATELET
Basophils Absolute: 0 10*3/uL (ref 0.0–0.2)
Basos: 0 %
EOS (ABSOLUTE): 0.3 10*3/uL (ref 0.0–0.4)
Eos: 3 %
Hematocrit: 44 % (ref 37.5–51.0)
Hemoglobin: 14.2 g/dL (ref 13.0–17.7)
Immature Grans (Abs): 0 10*3/uL (ref 0.0–0.1)
Immature Granulocytes: 1 %
Lymphocytes Absolute: 2.9 10*3/uL (ref 0.7–3.1)
Lymphs: 33 %
MCH: 29.8 pg (ref 26.6–33.0)
MCHC: 32.3 g/dL (ref 31.5–35.7)
MCV: 92 fL (ref 79–97)
Monocytes Absolute: 0.6 10*3/uL (ref 0.1–0.9)
Monocytes: 7 %
Neutrophils Absolute: 5 10*3/uL (ref 1.4–7.0)
Neutrophils: 56 %
Platelets: 255 10*3/uL (ref 150–450)
RBC: 4.77 x10E6/uL (ref 4.14–5.80)
RDW: 13.2 % (ref 11.6–15.4)
WBC: 8.8 10*3/uL (ref 3.4–10.8)

## 2023-09-03 NOTE — Progress Notes (Deleted)
GI Office Note    Referring Provider: Katherine Basset* Primary Care Physician:  Kara Pacer, NP  Primary Gastroenterologist: Dolores Frame, MD  Chief Complaint   No chief complaint on file.   History of Present Illness   Stanley Long is a 68 y.o. male presenting today at the request of [place], [place]* for ***rectal bleeding.   Colonoscopy June 2011 with Dr. Jena Gauss: -Normal rectum -Elongated and tortuous colon with normal mucosa -Repeat in 10 years  Last office visit 06/20/20 for abdominal bloating and discomfort.  He reported 2 months prior he had food poisoning and had diarrhea and bloating at that time.  Wife and son are also sick however since then he had reported episodes of dark urine, abdominal bloating, and discomfort.  Seen PCP and was prescribed 2 antibiotics as well as acid reflux medication without improvement.  Main complaint is persistent bloating.  Has stopped any peanut butter as noticed this worsened symptoms.  Also onions causing significant discomfort.  Has bloating throughout the day tomorrow what he eats.  Sleeping on his back given bloating.  Denies any overt abdominal pain.  Diarrhea recently improved however has occasional episodes of fecal urgency and explosive bowel movements.  States he is ordered have a CT of the abdomen pelvis but not yet completed given he was uninsured.  He denied any family history of GI or liver disease.  Symptoms presumed to be IBS and was counseled on a low FODMAP diet.  Is advised to start IBgard 1 tablet every 8-12 hours as needed.  UC visit 09/02/23. ***  Today:    Wt Readings from Last 3 Encounters:  02/09/23 218 lb (98.9 kg)  01/27/23 217 lb (98.4 kg)  01/21/23 219 lb (99.3 kg)    Current Outpatient Medications  Medication Sig Dispense Refill   aspirin EC 81 MG tablet Take 81 mg by mouth daily. Swallow whole.     atorvastatin (LIPITOR) 10 MG tablet Take 1 tablet (10 mg total) by  mouth daily. 90 tablet 0   Cinnamon 500 MG TABS Take 500 mg by mouth in the morning and at bedtime. (Patient not taking: Reported on 01/27/2023)     ERGOCALCIFEROL PO Take 1 tablet by mouth daily.     glipiZIDE (GLUCOTROL) 10 MG tablet Take 1 tablet (10 mg total) by mouth 2 (two) times daily before a meal. 180 tablet 0   HYDROcodone-acetaminophen (NORCO/VICODIN) 5-325 MG tablet Take 1 tablet by mouth every 6 (six) hours as needed for severe pain. (Patient not taking: Reported on 01/12/2023) 15 tablet 0   hydrocortisone (ANUSOL-HC) 25 MG suppository Place 1 suppository (25 mg total) rectally 2 (two) times daily as needed for hemorrhoids or anal itching. 12 suppository 0   ibuprofen (ADVIL) 400 MG tablet Take 1 tablet (400 mg total) by mouth every 6 (six) hours as needed. 30 tablet 0   lisinopril (ZESTRIL) 10 MG tablet Take 10 mg by mouth daily.     loratadine (CLARITIN) 10 MG tablet Take 10 mg by mouth daily.     metFORMIN (GLUCOPHAGE) 1000 MG tablet Take 1 tablet (1,000 mg total) by mouth 2 (two) times daily with a meal. 180 tablet 0   mirtazapine (REMERON) 15 MG tablet Take 15 mg by mouth at bedtime.     Multiple Vitamin (MULTIVITAMIN) capsule Take 1 capsule by mouth daily.     Omega-3 Fatty Acids (FISH OIL) 1000 MG CAPS Take 1 capsule by mouth in the morning and  at bedtime.     pantoprazole (PROTONIX) 20 MG tablet Take 20 mg by mouth daily.     tiZANidine (ZANAFLEX) 4 MG capsule Take 1 capsule (4 mg total) by mouth 3 (three) times daily as needed for muscle spasms. Do not drink alcohol or drive while taking this medication.  May cause drowsiness. 15 capsule 0   traMADol (ULTRAM) 50 MG tablet Take 1 tablet (50 mg total) by mouth every 12 (twelve) hours as needed. 10 tablet 0   TRULICITY 1.5 MG/0.5ML SOAJ Inject 0.75 mg into the skin once a week.     vitamin C (ASCORBIC ACID) 250 MG tablet Take 250 mg by mouth 2 (two) times daily.     vitamin E 1000 UNIT capsule Take 1,000 Units by mouth daily.  (Patient not taking: Reported on 01/27/2023)     No current facility-administered medications for this visit.    Past Medical History:  Diagnosis Date   Diabetes mellitus without complication (HCC)    GERD (gastroesophageal reflux disease)    Hypercholesteremia    Hypertension     Past Surgical History:  Procedure Laterality Date   ABDOMINOPLASTY     amputation great toe Left    BACK SURGERY     KNEE ARTHROSCOPY WITH LATERAL MENISECTOMY Right 02/13/2020   Procedure: KNEE ARTHROSCOPY WITH LATERAL MENISCECTOMY Right;  Surgeon: Vickki Hearing, MD;  Location: [facility];  Service: Orthopedics;  Laterality: Right;   SPINE SURGERY     cyst removal    Family History  Problem Relation Age of Onset   Diabetes Mother    Hypertension Mother    Stroke Mother    Diabetes Father    Heart disease Father     Allergies as of 09/06/2023   (No Known Allergies)    Social History   Socioeconomic History   Marital status: Married    Spouse name: Not on file   Number of children: Not on file   Years of education: Not on file   Highest education level: Not on file  Occupational History   Not on file  Tobacco Use   Smoking status: Never   Smokeless tobacco: Never  Vaping Use   Vaping status: Never Used  Substance and Sexual Activity   Alcohol use: Not Currently   Drug use: Never   Sexual activity: Not Currently  Other Topics Concern   Not on file  Social History Narrative   Not on file   Social Drivers of Health   Financial Resource Strain: Not on file  Food Insecurity: Not on file  Transportation Needs: Not on file  Physical Activity: Not on file  Stress: Not on file  Social Connections: Not on file  Intimate Partner Violence: Not on file     Review of Systems   Gen: Denies any fever, chills, fatigue, weight loss, lack of appetite.  CV: Denies chest pain, heart palpitations, peripheral edema, syncope.  Resp: Denies shortness of breath at rest or with exertion.  Denies wheezing or cough.  GI: see HPI GU : Denies urinary burning, urinary frequency, urinary hesitancy MS: Denies joint pain, muscle weakness, cramps, or limitation of movement.  Derm: Denies rash, itching, dry skin Psych: Denies depression, anxiety, memory loss, and confusion Heme: Denies bruising, bleeding, and enlarged lymph nodes.  Physical Exam   There were no vitals taken for this visit.  General:   Alert and oriented. Pleasant and cooperative. Well-nourished and well-developed.  Head:  Normocephalic and atraumatic. Eyes:  Without icterus,  sclera clear and conjunctiva pink.  Ears:  Normal auditory acuity. Mouth:  No deformity or lesions, oral mucosa pink.  Lungs:  Clear to auscultation bilaterally. No wheezes, rales, or rhonchi. No distress.  Heart:  S1, S2 present without murmurs appreciated.  Abdomen:  +BS, soft, non-tender and non-distended. No HSM noted. No guarding or rebound. No masses appreciated. *** Rectal:  Deferred  Msk:  Symmetrical without gross deformities. Normal posture. Extremities:  Without edema. Neurologic:  Alert and  oriented x4;  grossly normal neurologically. Skin:  Intact without significant lesions or rashes. Psych:  Alert and cooperative. Normal mood and affect.  Assessment   Stanley Long is a 68 y.o. male with a history of diabetes, HLD, and HTN presenting today with ***  Rectal bleeding:   Colon cancer screening: Last colonoscopy in 2011 was normal.  Currently overdue for screening, however in the setting of rectal bleeding patient's colonoscopy would be considered diagnostic.***  PLAN   ***   ** New patient ginve >3 years.   Brooke Bonito, MSN, FNP-BC, AGACNP-BC [facility]

## 2023-09-06 ENCOUNTER — Ambulatory Visit: Payer: Medicare HMO | Admitting: Gastroenterology

## 2023-09-07 ENCOUNTER — Other Ambulatory Visit: Payer: Self-pay

## 2023-09-07 ENCOUNTER — Emergency Department (HOSPITAL_COMMUNITY)
Admission: EM | Admit: 2023-09-07 | Discharge: 2023-09-07 | Disposition: A | Discharge status: Home / Self Care | Payer: Medicare HMO | Attending: Emergency Medicine | Admitting: Emergency Medicine

## 2023-09-07 ENCOUNTER — Emergency Department (HOSPITAL_COMMUNITY): Payer: Medicare HMO

## 2023-09-07 DIAGNOSIS — Z7982 Long term (current) use of aspirin: Secondary | ICD-10-CM | POA: Insufficient documentation

## 2023-09-07 DIAGNOSIS — I10 Essential (primary) hypertension: Secondary | ICD-10-CM | POA: Diagnosis not present

## 2023-09-07 DIAGNOSIS — Z79899 Other long term (current) drug therapy: Secondary | ICD-10-CM | POA: Insufficient documentation

## 2023-09-07 DIAGNOSIS — E119 Type 2 diabetes mellitus without complications: Secondary | ICD-10-CM | POA: Diagnosis not present

## 2023-09-07 DIAGNOSIS — W44B0XA Plastic object unspecified, entering into or through a natural orifice, initial encounter: Secondary | ICD-10-CM | POA: Insufficient documentation

## 2023-09-07 DIAGNOSIS — Z7984 Long term (current) use of oral hypoglycemic drugs: Secondary | ICD-10-CM | POA: Insufficient documentation

## 2023-09-07 DIAGNOSIS — T185XXA Foreign body in anus and rectum, initial encounter: Secondary | ICD-10-CM | POA: Diagnosis present

## 2023-09-07 NOTE — ED Triage Notes (Signed)
Pt reports unable to have a stool since Thurs. Pt also reported an object stuck in rectum with some blood noted at first but none now.

## 2023-09-07 NOTE — Discharge Instructions (Addendum)
We evaluated you for a foreign body in your rectum.  We were able to remove this.  Please follow-up closely with your primary care doctor.  Please return if you develop any abdominal pain, nausea or vomiting, worsening rectal pain, fevers or chills, or any other concerning symptoms.

## 2023-09-07 NOTE — ED Provider Notes (Signed)
[facility] AT [facility] Provider Note  CSN: [id number] Arrival date & time: 09/07/23 4098  Chief Complaint(s) Foreign Body in Rectum  HPI Stanley Long is a 68 y.o. male history of diabetes, hypertension, hyperlipidemia presenting to the emergency department with rectal foreign body.  Patient reports reports that a "piece of plastic" just "slipped" into his rectum, although later admits that he placed it in there.  It has been there since last Thursday.  Has not been able to have a bowel movement.  He went to an urgent care on Thursday afterwards, he did not tell them that he was concerned about a foreign body, so they suspect his symptoms could be due to hemorrhoids and discharged him.  No nausea, vomiting, abdominal pain.  Reports some rectal pain.  Reports some blood from his rectum.  Does not take a blood thinner.  No fevers or chills.   Past Medical History Past Medical History:  Diagnosis Date   Diabetes mellitus without complication (HCC)    GERD (gastroesophageal reflux disease)    Hypercholesteremia    Hypertension    Patient Active Problem List   Diagnosis Date Noted   IBS (irritable bowel syndrome) 06/20/2020   S/P right knee arthroscopy 02/13/20  02/21/2020   Meniscus, lateral, derangement, right    Home Medication(s) Prior to Admission medications   Medication Sig Start Date End Date Taking? Authorizing Provider  aspirin EC 81 MG tablet Take 81 mg by mouth daily. Swallow whole.   Yes [provider]  atorvastatin (LIPITOR) 10 MG tablet Take 1 tablet (10 mg total) by mouth daily. 01/14/21  Yes Jacquelin Hawking, PA-C  ERGOCALCIFEROL PO Take 1 tablet by mouth daily.   Yes [provider]  glipiZIDE (GLUCOTROL) 10 MG tablet Take 1 tablet (10 mg total) by mouth 2 (two) times daily before a meal. 01/14/21  Yes Jacquelin Hawking, PA-C  HYDROcodone-acetaminophen (NORCO/VICODIN) 5-325 MG tablet Take 1 tablet by mouth every 6 (six)  hours as needed for severe pain. 08/27/21  Yes Wallis Bamberg, PA-C  hydrocortisone (ANUSOL-HC) 25 MG suppository Place 1 suppository (25 mg total) rectally 2 (two) times daily as needed for hemorrhoids or anal itching. 09/02/23  Yes Particia Nearing, PA-C  ibuprofen (ADVIL) 400 MG tablet Take 1 tablet (400 mg total) by mouth every 6 (six) hours as needed. 08/27/21  Yes Wallis Bamberg, PA-C  lisinopril (ZESTRIL) 10 MG tablet Take 10 mg by mouth daily. 12/04/22  Yes [provider]  loratadine (CLARITIN) 10 MG tablet Take 10 mg by mouth daily. 12/30/22  Yes [provider]  metFORMIN (GLUCOPHAGE) 1000 MG tablet Take 1 tablet (1,000 mg total) by mouth 2 (two) times daily with a meal. 01/14/21  Yes Jacquelin Hawking, PA-C  mirtazapine (REMERON) 15 MG tablet Take 15 mg by mouth at bedtime. 12/31/22  Yes [provider]  Multiple Vitamin (MULTIVITAMIN) capsule Take 1 capsule by mouth daily.   Yes [provider]  Omega-3 Fatty Acids (FISH OIL) 1000 MG CAPS Take 1 capsule by mouth in the morning and at bedtime.   Yes [provider]  pantoprazole (PROTONIX) 20 MG tablet Take 20 mg by mouth daily. 12/31/22  Yes [provider]  TRULICITY 1.5 MG/0.5ML SOAJ Inject 0.75 mg into the skin once a week. 08/12/23  Yes [provider]  vitamin C (ASCORBIC ACID) 250 MG tablet Take 250 mg by mouth 2 (two) times daily.   Yes [provider]  Cinnamon 500  MG TABS Take 500 mg by mouth in the morning and at bedtime. Patient not taking: Reported on 01/27/2023    [provider]  tiZANidine (ZANAFLEX) 4 MG capsule Take 1 capsule (4 mg total) by mouth 3 (three) times daily as needed for muscle spasms. Do not drink alcohol or drive while taking this medication.  May cause drowsiness. Patient not taking: Reported on 09/07/2023 05/23/22   Particia Nearing, PA-C  traMADol (ULTRAM) 50 MG tablet Take 1 tablet (50 mg total) by mouth every 12 (twelve) hours  as needed. 05/23/22   Particia Nearing, PA-C  vitamin E 1000 UNIT capsule Take 1,000 Units by mouth daily. Patient not taking: Reported on 01/27/2023    [provider]                                                                                                                                    Past Surgical History Past Surgical History:  Procedure Laterality Date   ABDOMINOPLASTY     amputation great toe Left    BACK SURGERY     KNEE ARTHROSCOPY WITH LATERAL MENISECTOMY Right 02/13/2020   Procedure: KNEE ARTHROSCOPY WITH LATERAL MENISCECTOMY Right;  Surgeon: Vickki Hearing, MD;  Location: [facility];  Service: Orthopedics;  Laterality: Right;   SPINE SURGERY     cyst removal   Family History Family History  Problem Relation Age of Onset   Diabetes Mother    Hypertension Mother    Stroke Mother    Diabetes Father    Heart disease Father     Social History Social History   Tobacco Use   Smoking status: Never   Smokeless tobacco: Never  Vaping Use   Vaping status: Never Used  Substance Use Topics   Alcohol use: Not Currently   Drug use: Never   Allergies Patient has no known allergies.  Review of Systems Review of Systems  All other systems reviewed and are negative.   Physical Exam Vital Signs  I have reviewed the triage vital signs BP (!) 158/124   Pulse 80   Temp 97.7 F (36.5 C) (Oral)   Resp 18   Ht 5\' 8"  (1.727 m)   Wt 104.8 kg   SpO2 99%   BMI 35.12 kg/m  Physical Exam Vitals and nursing note reviewed.  Constitutional:      General: He is not in acute distress.    Appearance: Normal appearance.  HENT:     Head: Normocephalic and atraumatic.     Mouth/Throat:     Mouth: Mucous membranes are moist.  Eyes:     Conjunctiva/sclera: Conjunctivae normal.  Cardiovascular:     Rate and Rhythm: Normal rate.  Pulmonary:     Effort: Pulmonary effort is normal. No respiratory distress.  Abdominal:     General: Abdomen is flat.      Palpations: Abdomen is soft.     Tenderness:  There is no abdominal tenderness.  Genitourinary:    Comments: Chaperoned by Water engineer.  Small amount of bright red blood present.  Some external hemorrhoids present.  Palpable foreign body.  See procedure note. Skin:    General: Skin is warm and dry.     Capillary Refill: Capillary refill takes less than 2 seconds.  Neurological:     General: No focal deficit present.     Mental Status: He is alert. Mental status is at baseline.  Psychiatric:        Mood and Affect: Mood normal.        Behavior: Behavior normal.     ED Results and Treatments Labs (all labs ordered are listed, but only abnormal results are displayed) Labs Reviewed - No data to display                                                                                                                        Radiology DG Abd 2 Views Result Date: 09/07/2023 CLINICAL DATA:  Rectal foreign body. EXAM: ABDOMEN - 2 VIEW COMPARISON:  None Available. FINDINGS: No radiopaque foreign object identified. There is no bowel dilatation or evidence of obstruction. Moderate stool throughout the colon. Degenerative changes of the spine. No acute osseous pathology. IMPRESSION: 1. No radiopaque foreign object identified. 2. Nonobstructive bowel gas pattern. Electronically Signed   By: Elgie Collard M.D.   On: 09/07/2023 10:27    Pertinent labs & imaging results that were available during my care of the patient were reviewed by me and considered in my medical decision making (see MDM for details).  Medications Ordered in ED Medications - No data to display                                                                                                                                   Procedures .Foreign Body Removal  Date/Time: 09/07/2023 9:52 AM  Performed by: Lonell Grandchild, MD Authorized by: Lonell Grandchild, MD  Consent: Verbal consent obtained. Risks and benefits: risks,  benefits and alternatives were discussed Consent given by: patient Body area: rectum  Sedation: Patient sedated: no  Patient cooperative: yes Localization method: probed Removal mechanism: digital extraction Complexity: complex 1 objects recovered. Objects recovered: plastic object ~ 12 cm long, ?vaccum attachment Post-procedure assessment: foreign body removed Patient tolerance: patient tolerated the procedure well with no immediate complications    (including  critical care time)  Medical Decision Making / ED Course   MDM:  68 year old presenting to the emergency department with rectal foreign body.  Rectal foreign object was palpated and removed without difficulty.  He did have some blood in his rectum prior to removal as well as afterwards.  Will observe, but low concern for any perforation.  Will obtain x-ray status post removal.  Will reassess.  Clinical Course as of 09/07/23 1130  Tue Sep 07, 2023  1129 X-ray with nonobstructive bowel gas pattern.  No evidence of residual foreign body.  Patient well-appearing.  Denies any ongoing symptoms or pain.  Has been observed for a few hours after removal. Will discharge patient to home. All questions answered. Patient comfortable with plan of discharge. Return precautions discussed with patient and specified on the after visit summary.  [WS]    Clinical Course User Index [WS] Lonell Grandchild, MD     Additional history obtained:  -External records from outside source obtained and reviewed including: Chart review including previous notes, labs, imaging, consultation notes including recent UC note    Imaging Studies ordered: I ordered imaging studies including DG abd On my interpretation imaging demonstrates no residual foreign body I independently visualized and interpreted imaging. I agree with the radiologist interpretation   Medicines ordered and prescription drug management: No orders of the defined types were  placed in this encounter.   -I have reviewed the patients home medicines and have made adjustments as needed    Social Determinants of Health:  Diagnosis or treatment significantly limited by social determinants of health: obesity   Reevaluation: After the interventions noted above, I reevaluated the patient and found that their symptoms have resolved  Co morbidities that complicate the patient evaluation  Past Medical History:  Diagnosis Date   Diabetes mellitus without complication (HCC)    GERD (gastroesophageal reflux disease)    Hypercholesteremia    Hypertension       Dispostion: Disposition decision including need for hospitalization was considered, and patient discharged from emergency department.    Final Clinical Impression(s) / ED Diagnoses Final diagnoses:  Foreign body of rectum, initial encounter     This chart was dictated using voice recognition software.  Despite best efforts to proofread,  errors can occur which can change the documentation meaning.    Lonell Grandchild, MD 09/07/23 1130

## 2023-09-08 ENCOUNTER — Ambulatory Visit: Payer: Medicare HMO | Admitting: Internal Medicine

## 2024-01-03 ENCOUNTER — Ambulatory Visit: Payer: Medicare HMO | Admitting: Nurse Practitioner

## 2024-03-06 LAB — TSH: TSH: 1.17 (ref 0.41–5.90)

## 2024-03-06 LAB — COMPREHENSIVE METABOLIC PANEL WITH GFR: eGFR: 83

## 2024-03-06 LAB — LIPID PANEL
LDL Cholesterol: 51
Triglycerides: 279 — AB (ref 40–160)

## 2024-03-06 LAB — BASIC METABOLIC PANEL WITH GFR
BUN: 15 (ref 4–21)
Creatinine: 1 (ref 0.6–1.3)
Glucose: 280

## 2024-03-06 LAB — HEMOGLOBIN A1C: Hemoglobin A1C: 10.6

## 2024-03-14 NOTE — Patient Instructions (Signed)

## 2024-03-16 ENCOUNTER — Ambulatory Visit: Admitting: Nurse Practitioner

## 2024-03-16 ENCOUNTER — Encounter: Payer: Self-pay | Admitting: Nurse Practitioner

## 2024-03-16 VITALS — BP 116/80 | HR 71 | Ht 66.0 in | Wt 215.6 lb

## 2024-03-16 DIAGNOSIS — E1165 Type 2 diabetes mellitus with hyperglycemia: Secondary | ICD-10-CM | POA: Diagnosis not present

## 2024-03-16 DIAGNOSIS — Z7985 Long-term (current) use of injectable non-insulin antidiabetic drugs: Secondary | ICD-10-CM

## 2024-03-16 DIAGNOSIS — E782 Mixed hyperlipidemia: Secondary | ICD-10-CM | POA: Diagnosis not present

## 2024-03-16 DIAGNOSIS — I10 Essential (primary) hypertension: Secondary | ICD-10-CM

## 2024-03-16 DIAGNOSIS — Z7984 Long term (current) use of oral hypoglycemic drugs: Secondary | ICD-10-CM

## 2024-03-16 MED ORDER — DEXCOM G7 RECEIVER DEVI: 1.0000 | Freq: Once | 0 refills | Status: AC

## 2024-03-16 MED ORDER — GLIPIZIDE 5 MG PO TABS: 5.0000 mg | ORAL_TABLET | Freq: Two times a day (BID) | ORAL | 1 refills | Status: AC

## 2024-03-16 MED ORDER — METFORMIN HCL 1000 MG PO TABS: 1000.0000 mg | ORAL_TABLET | Freq: Two times a day (BID) | ORAL | 1 refills | Status: AC

## 2024-03-16 MED ORDER — TRULICITY 4.5 MG/0.5ML ~~LOC~~ SOAJ: 4.5000 mg | SUBCUTANEOUS | 1 refills | Status: AC

## 2024-03-16 MED ORDER — DEXCOM G7 SENSOR MISC: 1.0000 | 3 refills | Status: AC

## 2024-03-16 NOTE — Progress Notes (Signed)
 Endocrinology Consult Note       03/16/2024, 9:54 AM   Subjective:    Patient ID: Stanley Long, male    DOB: [birth year].  Stanley Long is being seen in consultation for management of currently uncontrolled symptomatic diabetes requested by  Nsumanganyi, Kalombo Cesar, NP.   Past Medical History:  Diagnosis Date   Diabetes mellitus without complication (HCC)    GERD (gastroesophageal reflux disease)    Hypercholesteremia    Hypertension     Past Surgical History:  Procedure Laterality Date   ABDOMINOPLASTY     amputation great toe Left    BACK SURGERY     KNEE ARTHROSCOPY WITH LATERAL MENISECTOMY Right 02/13/2020   Procedure: KNEE ARTHROSCOPY WITH LATERAL MENISCECTOMY Right;  Surgeon: Margrette Taft BRAVO, MD;  Location: [facility];  Service: Orthopedics;  Laterality: Right;   SPINE SURGERY     cyst removal    Social History   Socioeconomic History   Marital status: Married    Spouse name: Not on file   Number of children: Not on file   Years of education: Not on file   Highest education level: Not on file  Occupational History   Not on file  Tobacco Use   Smoking status: Never   Smokeless tobacco: Never  Vaping Use   Vaping status: Never Used  Substance and Sexual Activity   Alcohol use: Not Currently   Drug use: Never   Sexual activity: Not Currently  Other Topics Concern   Not on file  Social History Narrative   Not on file   Social Drivers of Health   Financial Resource Strain: Not on file  Food Insecurity: Not on file  Transportation Needs: Not on file  Physical Activity: Not on file  Stress: Not on file  Social Connections: Not on file    Family History  Problem Relation Age of Onset   Diabetes Mother    Hypertension Mother    Stroke Mother    Diabetes Father    Heart disease Father     Outpatient Encounter Medications as of 03/16/2024  Medication Sig   aspirin EC 81 MG  tablet Take 81 mg by mouth daily. Swallow whole.   B Complex-C-Folic Acid (SM B SUPER VITAMIN COMPLEX PO) Take by mouth daily.   Cinnamon 500 MG TABS Take 500 mg by mouth in the morning and at bedtime.   Continuous Glucose Receiver (DEXCOM G7 RECEIVER) DEVI 1 Device by Does not apply route once for 1 dose.   Continuous Glucose Sensor (DEXCOM G7 SENSOR) MISC Inject 1 Application into the skin as directed. Change sensor every 10 days as directed.   Cyanocobalamin (VITAMIN B 12) 500 MCG TABS Take 500 mg by mouth daily.   Dulaglutide  (TRULICITY ) 4.5 MG/0.5ML SOAJ Inject 4.5 mg as directed once a week.   ERGOCALCIFEROL PO Take 1 tablet by mouth daily.   glipiZIDE  (GLUCOTROL ) 5 MG tablet Take 1 tablet (5 mg total) by mouth 2 (two) times daily before a meal.   HYDROcodone -acetaminophen  (NORCO/VICODIN) 5-325 MG tablet Take 1 tablet by mouth every 6 (six) hours as needed for severe pain.   hydrocortisone  (ANUSOL -HC) 25 MG suppository Place 1  suppository (25 mg total) rectally 2 (two) times daily as needed for hemorrhoids or anal itching.   ibuprofen  (ADVIL ) 400 MG tablet Take 1 tablet (400 mg total) by mouth every 6 (six) hours as needed.   lisinopril (ZESTRIL) 10 MG tablet Take 10 mg by mouth daily.   loratadine (CLARITIN) 10 MG tablet Take 10 mg by mouth daily.   lovastatin (MEVACOR) 20 MG tablet Take 20 mg by mouth at bedtime.   milk thistle 175 MG tablet Take 175 mg by mouth daily.   Misc Natural Products (PROSTATE COMPLETE PO) Take by mouth daily.   Multiple Vitamin (MULTIVITAMIN) capsule Take 1 capsule by mouth daily.   Multiple Vitamins-Minerals (VISION FORMULA PO) Take 1 capsule by mouth daily.   Omega-3 Fatty Acids (FISH OIL) 1000 MG CAPS Take 1 capsule by mouth in the morning and at bedtime.   OVER THE COUNTER MEDICATION Take 1,200 mg by mouth daily. [place] Cinnamon   pantoprazole (PROTONIX) 20 MG tablet Take 20 mg by mouth daily.   tamsulosin (FLOMAX) 0.4 MG CAPS capsule Take 0.4 mg by mouth  daily.   traMADol  (ULTRAM ) 50 MG tablet Take 1 tablet (50 mg total) by mouth every 12 (twelve) hours as needed.   vitamin C (ASCORBIC ACID) 250 MG tablet Take 250 mg by mouth 2 (two) times daily.   Vitamin D, Cholecalciferol, 25 MCG (1000 UT) CAPS Take 1,000 Units by mouth daily.   vitamin E 1000 UNIT capsule Take 1,000 Units by mouth daily.   [DISCONTINUED] glipiZIDE  (GLUCOTROL ) 10 MG tablet Take 1 tablet (10 mg total) by mouth 2 (two) times daily before a meal.   [DISCONTINUED] metFORMIN  (GLUCOPHAGE ) 1000 MG tablet Take 1 tablet (1,000 mg total) by mouth 2 (two) times daily with a meal.   [DISCONTINUED] pioglitazone (ACTOS) 45 MG tablet Take 45 mg by mouth daily.   [DISCONTINUED] TRULICITY  1.5 MG/0.5ML SOAJ Inject 0.75 mg into the skin once a week.   atorvastatin  (LIPITOR) 10 MG tablet Take 1 tablet (10 mg total) by mouth daily. (Patient not taking: Reported on 03/16/2024)   metFORMIN  (GLUCOPHAGE ) 1000 MG tablet Take 1 tablet (1,000 mg total) by mouth 2 (two) times daily with a meal.   mirtazapine (REMERON) 15 MG tablet Take 15 mg by mouth at bedtime. (Patient not taking: Reported on 03/16/2024)   tiZANidine  (ZANAFLEX ) 4 MG capsule Take 1 capsule (4 mg total) by mouth 3 (three) times daily as needed for muscle spasms. Do not drink alcohol or drive while taking this medication.  May cause drowsiness. (Patient not taking: Reported on 03/16/2024)   No facility-administered encounter medications on file as of 03/16/2024.    ALLERGIES: No Known Allergies  VACCINATION STATUS: Immunization History  Administered Date(s) Administered   Moderna Sars-Covid-2 Vaccination 12/29/2019, 01/26/2020    Diabetes He presents for his initial diabetic visit. He has type 2 diabetes mellitus. Onset time: diagnosed at approx age of 48. His disease course has been fluctuating. Hypoglycemia symptoms include hunger, nervousness/anxiousness, sweats and tremors. There are no diabetic associated symptoms. There are no  hypoglycemic complications. Diabetic complications include impotence and nephropathy. Risk factors for coronary artery disease include diabetes mellitus, dyslipidemia, family history, obesity, male sex, hypertension and sedentary lifestyle. Current diabetic treatment includes oral agent (triple therapy) (and Trulicity ). He is compliant with treatment most of the time. His weight is fluctuating dramatically. He is following a generally unhealthy diet. When asked about meal planning, he reported none. He has not had a previous visit with a  dietitian. He rarely participates in exercise. (He presents today for his consultation with no meter or logs to review.  He does not check glucose often, just when he feels off.  His most recent A1c on 7/21 was 10.6%, increasing from last A1c of 8.7%.  He drinks water and zero sugar Mt Dew.  He does not eat on any routine pattern.  He does not exercise routinely.  He is UTD on eye exam, has seen podiatry in the past.) An ACE inhibitor/angiotensin II receptor blocker is being taken. He sees a podiatrist.Eye exam is current.     Review of systems  Constitutional: + fluctuating body weight, current Body mass index is 34.8 kg/m., no fatigue, no subjective hyperthermia, no subjective hypothermia Eyes: no blurry vision, no xerophthalmia ENT: no sore throat, no nodules palpated in throat, no dysphagia/odynophagia, no hoarseness Cardiovascular: no chest pain, no shortness of breath, no palpitations, no leg swelling Respiratory: no cough, no shortness of breath Gastrointestinal: no nausea/vomiting/diarrhea Musculoskeletal: no muscle/joint aches Skin: no rashes, no hyperemia Neurological: no tremors, no numbness, no tingling, no dizziness Psychiatric: no depression, no anxiety  Objective:     BP 116/80 (BP Location: Left Arm, Patient Position: Sitting, Cuff Size: Large)   Pulse 71   Ht 5' 6 (1.676 m)   Wt 215 lb 9.6 oz (97.8 kg)   BMI 34.80 kg/m   Wt Readings  from Last 3 Encounters:  03/16/24 215 lb 9.6 oz (97.8 kg)  09/07/23 231 lb (104.8 kg)  02/09/23 218 lb (98.9 kg)     BP Readings from Last 3 Encounters:  03/16/24 116/80  09/07/23 (!) 158/124  09/02/23 (!) 145/86     Physical Exam- Limited  Constitutional:  Body mass index is 34.8 kg/m. , not in acute distress, normal state of mind Eyes:  EOMI, no exophthalmos Neck: Supple Cardiovascular: RRR, no murmurs, rubs, or gallops, nonpitting edema to BLE Respiratory: Adequate breathing efforts, no crackles, rales, rhonchi, or wheezing Musculoskeletal: no gross deformities, strength intact in all four extremities, no gross restriction of joint movements Skin:  no rashes, no hyperemia Neurological: no tremor with outstretched hands   Diabetic Foot Exam - Simple   No data filed      CMP ( most recent) CMP     Component Value Date/Time   NA 137 01/16/2021 0830   K 4.0 01/16/2021 0830   CL 103 01/16/2021 0830   CO2 26 01/16/2021 0830   GLUCOSE 257 (H) 01/16/2021 0830   BUN 15 03/06/2024 0000   CREATININE 1.0 03/06/2024 0000   CREATININE 0.76 01/16/2021 0830   CALCIUM  9.1 01/16/2021 0830   PROT 7.3 01/16/2021 0830   ALBUMIN 4.1 01/16/2021 0830   AST 18 01/16/2021 0830   ALT 22 01/16/2021 0830   ALKPHOS 84 01/16/2021 0830   BILITOT 1.1 01/16/2021 0830   EGFR 83 03/06/2024 0000   GFRNONAA >60 01/16/2021 0830     Diabetic Labs (most recent): Lab Results  Component Value Date   HGBA1C 10.6 03/06/2024   HGBA1C 8.7 08/27/2023   HGBA1C 10 01/26/2023   MICROALBUR 19.1 (H) 05/30/2020     Lipid Panel ( most recent) Lipid Panel     Component Value Date/Time   CHOL 131 01/16/2021 0830   TRIG 279 (A) 03/06/2024 0000   HDL 31 (L) 01/16/2021 0830   CHOLHDL 4.2 01/16/2021 0830   VLDL 29 01/16/2021 0830   LDLCALC 51 03/06/2024 0000      Lab Results  Component Value Date  TSH 1.17 03/06/2024           Assessment & Plan:   1) Type 2 diabetes mellitus with  hyperglycemia, without Long-term current use of insulin  (HCC) (Primary)  He presents today for his consultation with no meter or logs to review.  He does not check glucose often, just when he feels off.  His most recent A1c on 7/21 was 10.6%, increasing from last A1c of 8.7%.  He drinks water and zero sugar Mt Dew.  He does not eat on any routine pattern.  He does not exercise routinely.  He is UTD on eye exam, has seen podiatry in the past.  - Stanley Long has currently uncontrolled symptomatic type 2 DM since 68 years of age, with most recent A1c of 10.6 %.   -Recent labs reviewed.  - I had a Long discussion with him about the progressive nature of diabetes and the pathology behind its complications. -his diabetes is complicated by mild CKD and he remains at a high risk for more acute and chronic complications which include CAD, CVA, CKD, retinopathy, and neuropathy. These are all discussed in detail with him.  The following Lifestyle Medicine recommendations according to American College of Lifestyle Medicine [facility]) were discussed and offered to patient and he agrees to start the journey:  A. Whole Foods, Plant-based plate comprising of fruits and vegetables, plant-based proteins, whole-grain carbohydrates was discussed in detail with the patient.   A list for source of those nutrients were also provided to the patient.  Patient will use only water or unsweetened tea for hydration. B.  The need to stay away from risky substances including alcohol, smoking; obtaining 7 to 9 hours of restorative sleep, at least 150 minutes of moderate intensity exercise weekly, the importance of healthy social connections,  and stress reduction techniques were discussed. C.  A full color page of  Calorie density of various food groups per pound showing examples of each food groups was provided to the patient.  - I have counseled him on diet and weight management by adopting a carbohydrate restricted/protein rich  diet. Patient is encouraged to switch to unprocessed or minimally processed complex starch and increased protein intake (animal or plant source), fruits, and vegetables. -  he is advised to stick to a routine mealtimes to eat 3 meals a day and avoid unnecessary snacks (to snack only to correct hypoglycemia).   - he acknowledges that there is a room for improvement in his food and drink choices. - Suggestion is made for him to avoid simple carbohydrates from his diet including Cakes, Sweet Desserts, Ice Cream, Soda (diet and regular), Sweet Tea, Candies, Chips, Cookies, Store Bought Juices, Alcohol in Excess of 1-2 drinks a day, Artificial Sweeteners, Coffee Creamer, and Sugar-free Products. This will help patient to have more stable blood glucose profile and potentially avoid unintended weight gain.  - I have approached him with the following individualized plan to manage his diabetes and patient agrees:    -he is encouraged to start/continue monitoring glucose 2 times daily, before breakfast and before bed, and to call the clinic if he has readings less than 70 or above 300 for 3 tests in a row.  I did send in for CGM to [facility] as I feel this would benefit him to see what foods are causing higher spikes in sugar.  - Adjustment parameters are given to him for hypo and hyperglycemia in writing.  - he is advised to continue Metformin  1000  mg po twice daily, therapeutically suitable for patient.  He can also continue Trulicity  4.5 mg SQ weekly (recently increased by his PCP).  Will lower his Glipizide  to 5 mg po twice daily with meals, a much safer dose.  - his Actos will be discontinued, risk outweighs benefit for this patient-especially given ankle swelling.  - Specific targets for  A1c; LDL, HDL, and Triglycerides were discussed with the patient.  2) Blood Pressure /Hypertension:  his blood pressure is controlled to target.   he is advised to continue his current medications as prescribed  by his PCP.  3) Lipids/Hyperlipidemia:    Review of his recent lipid panel from 03/06/24 showed controlled LDL at 51 and elevated triglycerides of 279.  he is advised to continue Lovastatin 20 mg daily at bedtime.  Side effects and precautions discussed with him.  We also discussed avoiding fried, fatty foods.  4)  Weight/Diet:  his Body mass index is 34.8 kg/m.  -  clearly complicating his diabetes care.   he is a candidate for weight loss. I discussed with him the fact that loss of 5 - 10% of his  current body weight will have the most impact on his diabetes management.  Exercise, and detailed carbohydrates information provided  -  detailed on discharge instructions.  5) Chronic Care/Health Maintenance: -he is on ACEI/ARB and Statin medications and is encouraged to initiate and continue to follow up with Ophthalmology, Dentist, Podiatrist at least yearly or according to recommendations, and advised to stay away from smoking. I have recommended yearly flu vaccine and pneumonia vaccine at least every 5 years; moderate intensity exercise for up to 150 minutes weekly; and sleep for at least 7 hours a day.  - he is advised to maintain close follow up with Nsumanganyi, Kalombo Cesar, NP for primary care needs, as well as his other providers for optimal and coordinated care.   - Time spent in this patient care: 60 min, which was spent in counseling him about his diabetes and the rest reviewing his blood glucose logs, discussing his hypoglycemia and hyperglycemia episodes, reviewing his current and previous labs/studies (including abstraction from other facilities) and medications doses and developing a Long term treatment plan based on the latest standards of care/guidelines; and documenting his care.    Please refer to Patient Instructions for Blood Glucose Monitoring and Insulin /Medications Dosing Guide in media tab for additional information. Please also refer to Patient Self Inventory in the Media  tab for reviewed elements of pertinent patient history.  Stanley Long participated in the discussions, expressed understanding, and voiced agreement with the above plans.  All questions were answered to his satisfaction. he is encouraged to contact clinic should he have any questions or concerns prior to his return visit.     Follow up plan: - Return in about 3 months (around 06/16/2024) for Diabetes F/U with A1c in office, No previsit labs, Bring meter and logs.    Benton Rio, [facility] [address] [place], [place] [address] Phone: [phone] Fax: [phone]  03/16/2024, 9:54 AM

## 2024-04-14 ENCOUNTER — Encounter (HOSPITAL_COMMUNITY)
Admission: RE | Admit: 2024-04-14 | Discharge: 2024-04-14 | Disposition: A | Discharge status: Home / Self Care | Source: Ambulatory Visit | Attending: Ophthalmology | Admitting: Ophthalmology

## 2024-04-18 NOTE — Progress Notes (Signed)
 Spoke with patient's daughter and told her it is important for him to call us  [phone] to complete PAT process for upcoming procedure

## 2024-04-19 NOTE — H&P (Signed)
 Surgical History & Physical  Patient Name: Stanley Long  DOB: 1956-03-12  Surgery: Cataract extraction with intraocular lens implant phacoemulsification; Left Eye Surgeon: Lynwood Hermann MD Surgery Date: 04/21/2024 Pre-Op Date: 03/14/2024  HPI: A 60 Yr. old male patient 1. The patient complains of difficulty when reading fine print, books, newspaper, instructions etc., which began many years ago. Both eyes are affected. The episode is constant. The patient describes dull, foggy and hazy symptoms affecting their eyes/vision. The condition's severity is worsening. Symptoms occur when the patient is reading. presents for a cataract evaluation. Patient states that he is unsure if he has cataracts. Patient states that he has been informed by an optometrist and informed the optometrist that it is hereditary. Patient states that his vision is blurred and seems hazy. Patient states that his greatest difficulty is trying to read small print. Patient states that he is bothered by glare at night and finds headlight glare bothersome. This is negatively affecting the patient's quality of life and the patient is unable to function adequately in life with the current level of vision. Denies any ocular pain, FOL or floaters. HPI Completed by Dr. Lynwood Hermann  Medical History:  Arthritis Diabetes - DM Type 2 High Blood Pressure LDL  Review of Systems Allergic/Immunologic Seasonal Allergies Cardiovascular High Blood Pressure Psychiatry Depression All recorded systems are negative except as noted above.  Social Never smoked   Medication Prednisolone-moxiflox-bromfen,  Metformin , Lisinopril, Pantoprazole, Pioglitazone, Lovastatin, Trulicity , Tamsulosin, Trulicity , Glipizide , Vitamin E (dl, acetate), Vitamin C, Prostate Control, Cinnamon, Milk of Magnesia, Ibuprofen , Aspirin, Vitamin D2, Fish Oil  Sx/Procedures None  Drug Allergies  NKDA  History & Physical: Heent: cataracts NECK: supple without  bruits LUNGS: lungs clear to auscultation CV: regular rate and rhythm Abdomen: soft and non-tender  Impression & Plan: Assessment: 1.  COMBINED FORMS AGE RELATED CATARACT; Both Eyes (H25.813) 2.  DM Type 2; Both Mod Without ME (Z88.6606) 3.  OAG BORDERLINE FINDINGS HIGH RISK; Both Eyes (H40.023) 4.  BLEPHARITIS; Right Upper Lid, Right Lower Lid, Left Upper Lid, Left Lower Lid (H01.001, H01.002,H01.004,H01.005) 5.  VITREOUS DETACHMENT PVD; Right Eye [phone])  Plan: 1.  Cataract accounts for the patient's decreased vision. This visual impairment is not correctable with a tolerable change in glasses or contact lenses. Cataract surgery with an implantation of a new lens should significantly improve the visual and functional status of the patient.Discussed all risks, benefits, alternatives, and potential complications. Discussed the procedures and recovery. Patient desires to have surgery. A-scan ordered and performed today for intra-ocular lens calculations. The surgery will be performed in order to improve vision for driving, reading, and for eye examinations. Recommend phacoemulsification with intra-ocular lens. Recommend Dextenza  for post-operative pain and inflammation. History of refractive Surgery: None Use of Eye Pressure Lowering Drops: None Left Eye worse - first. Dilates poorly - shugarcaine or Lidocaine +Omidira by protocol Malyugin Ring.  2.  Recommend good blood glucose control. OCT macula shows now edema 03/14/24.  3.  Based on cup-to-disc ratio. Negative Family history. IOP WNL OU. Will need complete glaucoma workup after cataract surgery. Detailed discussion about glaucoma today including importance of maintaining good follow up and following treatment plan, and the possibility of irreversible blindness as part of this disease process.  4.  Blepharitis is present - recommend regular lid cleaning.  5.  Old Asymptomatic. RD precautions given. Patient to call with increase in  flashing lights/floaters/dark curtain.

## 2024-04-20 NOTE — Pre-Procedure Instructions (Signed)
 Staff have attempted multiple times to reach patient without success. We were able to speak to daughter who was to have him call us  back, but we have not heard from patient. Daughter was also to give him arrival time and NPO status. Since we aren't sure if this happened or not, I messaged office to make them aware.

## 2024-04-21 ENCOUNTER — Ambulatory Visit (HOSPITAL_COMMUNITY): Admitting: Anesthesiology

## 2024-04-21 ENCOUNTER — Encounter (HOSPITAL_COMMUNITY): Payer: Self-pay | Admitting: Ophthalmology

## 2024-04-21 ENCOUNTER — Encounter (HOSPITAL_COMMUNITY)
Admission: RE | Disposition: A | Discharge status: Home / Self Care | Payer: Self-pay | Source: Home / Self Care | Attending: Ophthalmology

## 2024-04-21 ENCOUNTER — Ambulatory Visit (HOSPITAL_COMMUNITY)
Admission: RE | Admit: 2024-04-21 | Discharge: 2024-04-21 | Disposition: A | Discharge status: Home / Self Care | Attending: Ophthalmology | Admitting: Ophthalmology

## 2024-04-21 DIAGNOSIS — E1136 Type 2 diabetes mellitus with diabetic cataract: Secondary | ICD-10-CM | POA: Diagnosis not present

## 2024-04-21 DIAGNOSIS — H40023 Open angle with borderline findings, high risk, bilateral: Secondary | ICD-10-CM | POA: Insufficient documentation

## 2024-04-21 DIAGNOSIS — H25812 Combined forms of age-related cataract, left eye: Secondary | ICD-10-CM

## 2024-04-21 DIAGNOSIS — H0100B Unspecified blepharitis left eye, upper and lower eyelids: Secondary | ICD-10-CM | POA: Insufficient documentation

## 2024-04-21 DIAGNOSIS — H0100A Unspecified blepharitis right eye, upper and lower eyelids: Secondary | ICD-10-CM | POA: Diagnosis not present

## 2024-04-21 DIAGNOSIS — H2181 Floppy iris syndrome: Secondary | ICD-10-CM | POA: Insufficient documentation

## 2024-04-21 DIAGNOSIS — I10 Essential (primary) hypertension: Secondary | ICD-10-CM | POA: Diagnosis not present

## 2024-04-21 DIAGNOSIS — M1990 Unspecified osteoarthritis, unspecified site: Secondary | ICD-10-CM | POA: Diagnosis not present

## 2024-04-21 DIAGNOSIS — H43811 Vitreous degeneration, right eye: Secondary | ICD-10-CM | POA: Diagnosis not present

## 2024-04-21 DIAGNOSIS — K219 Gastro-esophageal reflux disease without esophagitis: Secondary | ICD-10-CM | POA: Insufficient documentation

## 2024-04-21 DIAGNOSIS — H01009 Unspecified blepharitis unspecified eye, unspecified eyelid: Secondary | ICD-10-CM | POA: Insufficient documentation

## 2024-04-21 DIAGNOSIS — K589 Irritable bowel syndrome without diarrhea: Secondary | ICD-10-CM | POA: Diagnosis not present

## 2024-04-21 DIAGNOSIS — H5712 Ocular pain, left eye: Secondary | ICD-10-CM | POA: Diagnosis present

## 2024-04-21 DIAGNOSIS — E119 Type 2 diabetes mellitus without complications: Secondary | ICD-10-CM

## 2024-04-21 DIAGNOSIS — H25813 Combined forms of age-related cataract, bilateral: Secondary | ICD-10-CM | POA: Diagnosis not present

## 2024-04-21 DIAGNOSIS — K000 Anodontia: Secondary | ICD-10-CM | POA: Insufficient documentation

## 2024-04-21 HISTORY — PX: INSERTION, STENT, DRUG-ELUTING, LACRIMAL CANALICULUS: SHX7453

## 2024-04-21 HISTORY — PX: CATARACT EXTRACTION W/PHACO: SHX586

## 2024-04-21 LAB — GLUCOSE, CAPILLARY: Glucose-Capillary: 192 mg/dL — ABNORMAL HIGH (ref 70–99)

## 2024-04-21 SURGERY — PHACOEMULSIFICATION, CATARACT, WITH IOL INSERTION
Anesthesia: Monitor Anesthesia Care | Site: Eye | Laterality: Left

## 2024-04-21 MED ORDER — STERILE WATER FOR IRRIGATION IR SOLN
Status: DC | PRN
  Administered 2024-04-21: 1

## 2024-04-21 MED ORDER — LIDOCAINE HCL (PF) 1 % IJ SOLN
INTRAMUSCULAR | Status: DC | PRN
  Administered 2024-04-21: 1 mL

## 2024-04-21 MED ORDER — DEXAMETHASONE 0.4 MG OP INST
VAGINAL_INSERT | OPHTHALMIC | Status: DC | PRN
  Administered 2024-04-21: .4 mg via OPHTHALMIC

## 2024-04-21 MED ORDER — SODIUM HYALURONATE 10 MG/ML IO SOLUTION
PREFILLED_SYRINGE | INTRAOCULAR | Status: DC | PRN
  Administered 2024-04-21: .85 mL via INTRAOCULAR

## 2024-04-21 MED ORDER — TROPICAMIDE 1 % OP SOLN
1.0000 [drp] | OPHTHALMIC | Status: AC | PRN
  Administered 2024-04-21 (×3): 1 [drp] via OPHTHALMIC

## 2024-04-21 MED ORDER — DEXAMETHASONE 0.4 MG OP INST
VAGINAL_INSERT | OPHTHALMIC | Status: AC
  Filled 2024-04-21: qty 1

## 2024-04-21 MED ORDER — MIDAZOLAM HCL 2 MG/2ML IJ SOLN
INTRAMUSCULAR | Status: AC
  Filled 2024-04-21: qty 2

## 2024-04-21 MED ORDER — TETRACAINE HCL 0.5 % OP SOLN: 1.0000 [drp] | OPHTHALMIC | Status: DC | PRN

## 2024-04-21 MED ORDER — SODIUM HYALURONATE 23MG/ML IO SOSY
PREFILLED_SYRINGE | INTRAOCULAR | Status: DC | PRN
Start: 2024-04-21 — End: 2024-04-21
  Administered 2024-04-21: .6 mL via INTRAOCULAR

## 2024-04-21 MED ORDER — SODIUM CHLORIDE 0.9% FLUSH
INTRAVENOUS | Status: DC | PRN
Start: 2024-04-21 — End: 2024-04-21
  Administered 2024-04-21: 10 mL via INTRAVENOUS

## 2024-04-21 MED ORDER — LIDOCAINE HCL 3.5 % OP GEL
1.0000 | Freq: Once | OPHTHALMIC | Status: AC
  Administered 2024-04-21: 1 via OPHTHALMIC

## 2024-04-21 MED ORDER — LACTATED RINGERS IV SOLN: INTRAVENOUS | Status: DC

## 2024-04-21 MED ORDER — PHENYLEPHRINE HCL 2.5 % OP SOLN
1.0000 [drp] | OPHTHALMIC | Status: AC | PRN
  Administered 2024-04-21 (×3): 1 [drp] via OPHTHALMIC

## 2024-04-21 MED ORDER — POVIDONE-IODINE 5 % OP SOLN
OPHTHALMIC | Status: DC | PRN
  Administered 2024-04-21: 1 via OPHTHALMIC

## 2024-04-21 MED ORDER — MOXIFLOXACIN HCL 5 MG/ML IO SOLN
INTRAOCULAR | Status: DC | PRN
  Administered 2024-04-21: .2 mL via INTRACAMERAL

## 2024-04-21 MED ORDER — PHENYLEPHRINE-KETOROLAC 1-0.3 % IO SOLN
INTRAOCULAR | Status: DC | PRN
  Administered 2024-04-21: 500 mL via OPHTHALMIC

## 2024-04-21 MED ORDER — MIDAZOLAM HCL 2 MG/2ML IJ SOLN
INTRAMUSCULAR | Status: DC | PRN
  Administered 2024-04-21: 2 mg via INTRAVENOUS

## 2024-04-21 MED ORDER — BSS IO SOLN
INTRAOCULAR | Status: DC | PRN
  Administered 2024-04-21: 15 mL via INTRAOCULAR

## 2024-04-21 SURGICAL SUPPLY — 12 items
CLOTH BEACON ORANGE TIMEOUT ST (SAFETY) ×1 IMPLANT
EYE SHIELD UNIVERSAL CLEAR (GAUZE/BANDAGES/DRESSINGS) IMPLANT
FEE CATARACT SUITE SIGHTPATH (MISCELLANEOUS) ×1 IMPLANT
GLOVE BIOGEL PI IND STRL 7.0 (GLOVE) ×2 IMPLANT
LENS IOL TECNIS EYHANCE 22.5 (Intraocular Lens) IMPLANT
NDL HYPO 18GX1.5 BLUNT FILL (NEEDLE) ×1 IMPLANT
NEEDLE HYPO 18GX1.5 BLUNT FILL (NEEDLE) ×1 IMPLANT
PAD ARMBOARD POSITIONER FOAM (MISCELLANEOUS) ×1 IMPLANT
RING MALYGIN 7.0 (MISCELLANEOUS) IMPLANT
SYR TB 1ML LL NO SAFETY (SYRINGE) ×1 IMPLANT
TAPE SURG TRANSPORE 1 IN (GAUZE/BANDAGES/DRESSINGS) IMPLANT
WATER STERILE IRR 250ML POUR (IV SOLUTION) ×1 IMPLANT

## 2024-04-21 NOTE — Anesthesia Preprocedure Evaluation (Addendum)
 Anesthesia Evaluation  Patient identified by MRN, date of birth, ID band Patient awake    Reviewed: Allergy & Precautions, H&P , NPO status , Patient's Chart, lab work & pertinent test results, reviewed documented beta blocker date and time   Airway Mallampati: II  TM Distance: >3 FB Neck ROM: full    Dental  (+) Dental Advisory Given, Missing, Loose Almost all the bottom teeth missing except 2 that are very loose.  All front upper teeth missing:   Pulmonary neg pulmonary ROS   Pulmonary exam normal breath sounds clear to auscultation       Cardiovascular Exercise Tolerance: Good hypertension, Normal cardiovascular exam Rhythm:regular Rate:Normal     Neuro/Psych negative neurological ROS  negative psych ROS   GI/Hepatic Neg liver ROS,GERD  ,,IBS   Endo/Other  diabetes, Type 2    Renal/GU negative Renal ROS  negative genitourinary   Musculoskeletal   Abdominal   Peds  Hematology negative hematology ROS (+)   Anesthesia Other Findings   Reproductive/Obstetrics negative OB ROS                              Anesthesia Physical Anesthesia Plan  ASA: 2  Anesthesia Plan: MAC   Post-op Pain Management: Minimal or no pain anticipated   Induction:   PONV Risk Score and Plan: Midazolam   Airway Management Planned: Natural Airway and Nasal Cannula  Additional Equipment: None  Intra-op Plan:   Post-operative Plan:   Informed Consent: I have reviewed the patients History and Physical, chart, labs and discussed the procedure including the risks, benefits and alternatives for the proposed anesthesia with the patient or authorized representative who has indicated his/her understanding and acceptance.     Dental Advisory Given  Plan Discussed with: CRNA  Anesthesia Plan Comments:          Anesthesia Quick Evaluation

## 2024-04-21 NOTE — Anesthesia Procedure Notes (Signed)
 Date/Time: 04/21/2024 10:51 AM  Performed by: Para Jerelene CROME, CRNAOxygen Delivery Method: Nasal cannula

## 2024-04-21 NOTE — Anesthesia Postprocedure Evaluation (Signed)
 Anesthesia Post Note  Patient: Stanley Long  Procedure(s) Performed: PHACOEMULSIFICATION, CATARACT, WITH IOL INSERTION (Left: Eye) INSERTION, STENT, DRUG-ELUTING, LACRIMAL CANALICULUS (Left: Eye)  Patient location during evaluation: Phase II Anesthesia Type: MAC Level of consciousness: awake and alert Pain management: pain level controlled Vital Signs Assessment: post-procedure vital signs reviewed and stable Respiratory status: spontaneous breathing, nonlabored ventilation, respiratory function stable and patient connected to nasal cannula oxygen Cardiovascular status: stable and blood pressure returned to baseline Postop Assessment: no apparent nausea or vomiting Anesthetic complications: no   There were no known notable events for this encounter.   Last Vitals:  Vitals:   04/21/24 0930 04/21/24 1113  BP: (!) 142/80 (!) 156/80  Pulse: 64 68  Resp: 18 14  Temp: 37.1 C 36.8 C  SpO2: 99% 99%    Last Pain:  Vitals:   04/21/24 1113  TempSrc:   PainSc: 0-No pain                 Shacarra Choe L Roczen Waymire

## 2024-04-21 NOTE — Transfer of Care (Addendum)
 Immediate Anesthesia Transfer of Care Note  Patient: Stanley Long  Procedure(s) Performed: PHACOEMULSIFICATION, CATARACT, WITH IOL INSERTION (Left: Eye) INSERTION, STENT, DRUG-ELUTING, LACRIMAL CANALICULUS (Left: Eye)  Patient Location: Short Stay  Anesthesia Type:MAC  Level of Consciousness: awake and patient cooperative  Airway & Oxygen Therapy: Patient Spontanous Breathing  Post-op Assessment: Report given to RN and Post -op Vital signs reviewed and stable  Post vital signs: Reviewed and stable  Last Vitals:  Vitals Value Taken Time  BP 156/80 04/21/24   1113  Temp 36.8 04/21/24   1113  Pulse 68 04/21/24   1113  Resp 14 04/21/24   1113  SpO2 99% 09/205/25 1113     Last Pain:  Vitals:   04/21/24 0930  TempSrc: Oral  PainSc: 0-No pain      Patients Stated Pain Goal: 6 (04/21/24 0930)  Complications: No notable events documented.

## 2024-04-21 NOTE — Op Note (Signed)
 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   Pain and inflammation following cataract surgery, Left Eye (H57.12)  Procedure:  Complex Removal of cataract via phacoemulsification and insertion of intra-ocular lens Johnson and Johnson DIB00 +22.5D into the capsular bag of the Left Eye [phone]) 2. Placement of Dextenza  Implant, Left Lower Lid  Attending surgeon: Lynwood LABOR. Leveta Wahab, MD, MA  Anesthesia: MAC, Topical Akten   Complications: None  Estimated Blood Loss: <40mL (minimal)  Specimens: None  Implants: As above  Indications:  Visually significant age-related cataract, Left Eye  Procedure:  The patient was seen and identified in the pre-operative area. The operative eye was identified and dilated.  The operative eye was marked.  Topical anesthesia was administered to the operative eye.     The patient was then to the operative suite and placed in the supine position.  A timeout was performed confirming the patient, procedure to be performed, and all other relevant information.   The patient's face was prepped and draped in the usual fashion for intra-ocular surgery.  A lid speculum was placed into the operative eye and the surgical microscope moved into place and focused. Poor dilation of the iris was confirmed. An inferotemporal paracentesis was created using a 20 gauge paracentesis blade.  Shugarcaine was injected into the anterior chamber.  Viscoelastic was injected into the anterior chamber.  A temporal clear-corneal main wound incision was created using a 2.82mm microkeratome.  A 7.63mm Malyugin ring was placed. A continuous curvilinear capsulorrhexis was initiated using an irrigating cystitome and completed using  capsulorrhexis forceps.  Hydrodissection and hydrodeliniation were performed.  Viscoelastic was injected into the anterior chamber.  A phacoemulsification handpiece and a chopper as a second instrument were used to remove the nucleus and epinucleus. The irrigation/aspiration handpiece was used to remove any remaining cortical material.   The capsular bag was reinflated with viscoelastic, checked, and found to be intact.  The intraocular lens was inserted into the capsular bag. The Malyugin ring was removed. The irrigation/aspiration handpiece was used to remove any remaining viscoelastic.  The clear corneal wound and paracentesis wounds were then hydrated and checked with Weck-Cels to be watertight. 0.1mL of Moxifloxacin  was injected into the anterior chamber. The lid-speculum was removed.    The lower canaliculus was dilated and filled with Provisc. A Dextenza  implant was placed in the lower canaliculus without complication.  The drape was removed.  The patient's face was cleaned with a wet and dry 4x4.   A clear shield was taped over the eye. The patient was taken to the post-operative care unit in good condition, having tolerated the procedure well.  Post-Op Instructions: The patient will follow up at [facility] for a same day post-operative evaluation and will receive all other orders and instructions.

## 2024-04-21 NOTE — Discharge Instructions (Signed)
 Please discharge patient when stable, will follow up today with Dr. June Leap at the [facility] office immediately following discharge.  Leave shield in place until visit.  All paperwork with discharge instructions will be given at the office.  [facility] Address:  [address]  [place], [place] [address]

## 2024-04-21 NOTE — Interval H&P Note (Signed)
 History and Physical Interval Note:  04/21/2024 10:45 AM  Stanley Long  has presented today for surgery, with the diagnosis of combined forms of age related cataract, left eye.  The various methods of treatment have been discussed with the patient and family. After consideration of risks, benefits and other options for treatment, the patient has consented to  Procedure(s) with comments: PHACOEMULSIFICATION, CATARACT, WITH IOL INSERTION (Left) - CDE: INSERTION, STENT, DRUG-ELUTING, LACRIMAL CANALICULUS (Left) as a surgical intervention.  The patient's history has been reviewed, patient examined, no change in status, stable for surgery.  I have reviewed the patient's chart and labs.  Questions were answered to the patient's satisfaction.     HARRIE AGENT

## 2024-04-24 ENCOUNTER — Encounter (HOSPITAL_COMMUNITY): Payer: Self-pay | Admitting: Ophthalmology

## 2024-04-28 ENCOUNTER — Encounter (HOSPITAL_COMMUNITY)
Admission: RE | Admit: 2024-04-28 | Discharge: 2024-04-28 | Disposition: A | Discharge status: Home / Self Care | Source: Ambulatory Visit | Attending: Ophthalmology | Admitting: Ophthalmology

## 2024-05-03 NOTE — H&P (Signed)
 Surgical History & Physical  Patient Name: Stanley Long  DOB: November 18, 1955  Surgery: Cataract extraction with intraocular lens implant phacoemulsification; Right Eye Surgeon: Lynwood Hermann MD Surgery Date: 05/05/2024 Pre-Op Date: 04/27/2024  HPI: A 70 Yr. old male patient 1.  The patient is returning for a 6 day cataract follow-up of the left eye. Since the last visit, the affected area is doing well. The patient's vision is improved. The condition's severity is constant. Patient is following medication instructions. Patient also here for persistently blurry vision in the right eye. Patient has significant glare with driving at night. Pt. is ready to proceed with cataract surgery on OD, due to blurred vision. This is negatively affecting the patient's quality of life and the patient is unable to function adequately in life with the current level of vision. HPI Completed by Dr. Lynwood Hermann  Medical History:  Arthritis Diabetes - DM Type 2 High Blood Pressure LDL  Review of Systems Allergic/Immunologic Seasonal Allergies Cardiovascular High Blood Pressure Psychiatry Depression All recorded systems are negative except as noted above.  Social Never smoked  Medication Prednisolone-moxiflox-bromfen,  Metformin , Lisinopril, Pantoprazole, Pioglitazone, Lovastatin, Trulicity , Tamsulosin, Trulicity , Glipizide , Vitamin E (dl, acetate), Vitamin C, Prostate Control, Cinnamon, Milk of Magnesia, Ibuprofen , Aspirin, Vitamin D2, Fish Oil  Sx/Procedures Phaco c IOL OS - dextenza   Drug Allergies  NKDA  History & Physical: Heent: cataract NECK: supple without bruits LUNGS: lungs clear to auscultation CV: regular rate and rhythm Abdomen: soft and non-tender  Impression & Plan: Assessment: 1.  CATARACT EXTRACTION STATUS; Left Eye (Z98.42) 2.  COMBINED FORMS AGE RELATED CATARACT; Right Eye (H25.811)  Plan: 1.  1 week after cataract surgery. Doing well with improved vision and normal eye  pressure. Call with any problems or concerns. Continue Pred-Moxi-Brom 2x/day for 3 more weeks.  2.  Cataract accounts for the patient's decreased vision. This visual impairment is not correctable with a tolerable change in glasses or contact lenses. Cataract surgery with an implantation of a new lens should significantly improve the visual and functional status of the patient. Discussed all risks, benefits, alternatives, and potential complications. Discussed the procedures and recovery. Patient desires to have surgery. A-scan ordered and performed today for intra-ocular lens calculations. The surgery will be performed in order to improve vision for driving, reading, and for eye examinations. Recommend phacoemulsification with intra-ocular lens. Recommend Dextenza  for post-operative pain and inflammation. History of refractive Surgery: None Use of Eye Pressure Lowering Drops: None Right Eye. Surgery required to correct imbalance of vision. Dilates poorly - shugarcaine or Lidocaine +Omidira by protocol Malyugin Ring.

## 2024-05-05 ENCOUNTER — Encounter (HOSPITAL_COMMUNITY): Payer: Self-pay | Admitting: Ophthalmology

## 2024-05-05 ENCOUNTER — Ambulatory Visit (HOSPITAL_COMMUNITY): Admitting: Anesthesiology

## 2024-05-05 ENCOUNTER — Ambulatory Visit (HOSPITAL_BASED_OUTPATIENT_CLINIC_OR_DEPARTMENT_OTHER): Admitting: Anesthesiology

## 2024-05-05 ENCOUNTER — Ambulatory Visit (HOSPITAL_COMMUNITY)
Admission: RE | Admit: 2024-05-05 | Discharge: 2024-05-05 | Disposition: A | Discharge status: Home / Self Care | Attending: Ophthalmology | Admitting: Ophthalmology

## 2024-05-05 ENCOUNTER — Encounter (HOSPITAL_COMMUNITY)
Admission: RE | Disposition: A | Discharge status: Home / Self Care | Payer: Self-pay | Source: Home / Self Care | Attending: Ophthalmology

## 2024-05-05 DIAGNOSIS — H2181 Floppy iris syndrome: Secondary | ICD-10-CM | POA: Diagnosis not present

## 2024-05-05 DIAGNOSIS — I10 Essential (primary) hypertension: Secondary | ICD-10-CM | POA: Diagnosis not present

## 2024-05-05 DIAGNOSIS — H5711 Ocular pain, right eye: Secondary | ICD-10-CM | POA: Insufficient documentation

## 2024-05-05 DIAGNOSIS — H25811 Combined forms of age-related cataract, right eye: Secondary | ICD-10-CM | POA: Diagnosis present

## 2024-05-05 DIAGNOSIS — K589 Irritable bowel syndrome without diarrhea: Secondary | ICD-10-CM | POA: Diagnosis not present

## 2024-05-05 DIAGNOSIS — Z9842 Cataract extraction status, left eye: Secondary | ICD-10-CM | POA: Insufficient documentation

## 2024-05-05 DIAGNOSIS — E119 Type 2 diabetes mellitus without complications: Secondary | ICD-10-CM | POA: Diagnosis not present

## 2024-05-05 DIAGNOSIS — K219 Gastro-esophageal reflux disease without esophagitis: Secondary | ICD-10-CM | POA: Insufficient documentation

## 2024-05-05 DIAGNOSIS — Z7984 Long term (current) use of oral hypoglycemic drugs: Secondary | ICD-10-CM | POA: Diagnosis not present

## 2024-05-05 DIAGNOSIS — E1136 Type 2 diabetes mellitus with diabetic cataract: Secondary | ICD-10-CM | POA: Diagnosis present

## 2024-05-05 HISTORY — PX: CATARACT EXTRACTION W/PHACO: SHX586

## 2024-05-05 HISTORY — PX: INSERTION, STENT, DRUG-ELUTING, LACRIMAL CANALICULUS: SHX7453

## 2024-05-05 LAB — GLUCOSE, CAPILLARY: Glucose-Capillary: 233 mg/dL — ABNORMAL HIGH (ref 70–99)

## 2024-05-05 SURGERY — PHACOEMULSIFICATION, CATARACT, WITH IOL INSERTION
Anesthesia: Monitor Anesthesia Care | Site: Eye | Laterality: Right

## 2024-05-05 MED ORDER — DEXAMETHASONE 0.4 MG OP INST
VAGINAL_INSERT | OPHTHALMIC | Status: AC
  Filled 2024-05-05: qty 1

## 2024-05-05 MED ORDER — SODIUM HYALURONATE 10 MG/ML IO SOLUTION
PREFILLED_SYRINGE | INTRAOCULAR | Status: DC | PRN
  Administered 2024-05-05: .85 mL via INTRAOCULAR

## 2024-05-05 MED ORDER — SODIUM CHLORIDE 0.9% FLUSH
INTRAVENOUS | Status: DC | PRN
Start: 2024-05-05 — End: 2024-05-05
  Administered 2024-05-05: 10 mL via INTRAVENOUS

## 2024-05-05 MED ORDER — PHENYLEPHRINE HCL 2.5 % OP SOLN
1.0000 [drp] | OPHTHALMIC | Status: AC | PRN
  Administered 2024-05-05 (×3): 1 [drp] via OPHTHALMIC

## 2024-05-05 MED ORDER — POVIDONE-IODINE 5 % OP SOLN
OPHTHALMIC | Status: DC | PRN
  Administered 2024-05-05: 1 via OPHTHALMIC

## 2024-05-05 MED ORDER — STERILE WATER FOR IRRIGATION IR SOLN
Status: DC | PRN
  Administered 2024-05-05: 1

## 2024-05-05 MED ORDER — MIDAZOLAM HCL 2 MG/2ML IJ SOLN
INTRAMUSCULAR | Status: AC
  Filled 2024-05-05: qty 2

## 2024-05-05 MED ORDER — LIDOCAINE HCL (PF) 1 % IJ SOLN
INTRAMUSCULAR | Status: DC | PRN
  Administered 2024-05-05: 1 mL

## 2024-05-05 MED ORDER — TROPICAMIDE 1 % OP SOLN
1.0000 [drp] | OPHTHALMIC | Status: AC | PRN
  Administered 2024-05-05 (×3): 1 [drp] via OPHTHALMIC

## 2024-05-05 MED ORDER — SODIUM HYALURONATE 23MG/ML IO SOSY
PREFILLED_SYRINGE | INTRAOCULAR | Status: DC | PRN
  Administered 2024-05-05: .6 mL via INTRAOCULAR

## 2024-05-05 MED ORDER — MOXIFLOXACIN HCL 5 MG/ML IO SOLN
INTRAOCULAR | Status: DC | PRN
  Administered 2024-05-05: .2 mL via INTRACAMERAL

## 2024-05-05 MED ORDER — LIDOCAINE HCL 3.5 % OP GEL
1.0000 | Freq: Once | OPHTHALMIC | Status: AC
  Administered 2024-05-05: 1 via OPHTHALMIC

## 2024-05-05 MED ORDER — MIDAZOLAM HCL 2 MG/2ML IJ SOLN
INTRAMUSCULAR | Status: DC | PRN
  Administered 2024-05-05: 2 mg via INTRAVENOUS

## 2024-05-05 MED ORDER — DEXAMETHASONE 0.4 MG OP INST
VAGINAL_INSERT | OPHTHALMIC | Status: DC | PRN
  Administered 2024-05-05: .4 mg via OPHTHALMIC

## 2024-05-05 MED ORDER — LACTATED RINGERS IV SOLN: INTRAVENOUS | Status: DC

## 2024-05-05 MED ORDER — BSS IO SOLN
INTRAOCULAR | Status: DC | PRN
  Administered 2024-05-05: 15 mL via INTRAOCULAR

## 2024-05-05 MED ORDER — PHENYLEPHRINE-KETOROLAC 1-0.3 % IO SOLN
INTRAOCULAR | Status: DC | PRN
  Administered 2024-05-05: 500 mL via OPHTHALMIC

## 2024-05-05 MED ORDER — TETRACAINE HCL 0.5 % OP SOLN
1.0000 [drp] | OPHTHALMIC | Status: AC | PRN
  Administered 2024-05-05 (×3): 1 [drp] via OPHTHALMIC

## 2024-05-05 SURGICAL SUPPLY — 12 items
CLOTH BEACON ORANGE TIMEOUT ST (SAFETY) ×1 IMPLANT
EYE SHIELD UNIVERSAL CLEAR (GAUZE/BANDAGES/DRESSINGS) IMPLANT
FEE CATARACT SUITE SIGHTPATH (MISCELLANEOUS) ×1 IMPLANT
GLOVE BIOGEL PI IND STRL 7.0 (GLOVE) ×2 IMPLANT
LENS IOL TECNIS EYHANCE 21.5 (Intraocular Lens) IMPLANT
NDL HYPO 18GX1.5 BLUNT FILL (NEEDLE) ×1 IMPLANT
NEEDLE HYPO 18GX1.5 BLUNT FILL (NEEDLE) ×1 IMPLANT
PAD ARMBOARD POSITIONER FOAM (MISCELLANEOUS) ×1 IMPLANT
RING MALYGIN 7.0 (MISCELLANEOUS) IMPLANT
SYR TB 1ML LL NO SAFETY (SYRINGE) ×1 IMPLANT
TAPE SURG TRANSPORE 1 IN (GAUZE/BANDAGES/DRESSINGS) IMPLANT
WATER STERILE IRR 250ML POUR (IV SOLUTION) ×1 IMPLANT

## 2024-05-05 NOTE — Interval H&P Note (Signed)
 History and Physical Interval Note:  05/05/2024 8:20 AM  Stanley Long  has presented today for surgery, with the diagnosis of combined forms age related cataract, right eye.  The various methods of treatment have been discussed with the patient and family. After consideration of risks, benefits and other options for treatment, the patient has consented to  Procedure(s): PHACOEMULSIFICATION, CATARACT, WITH IOL INSERTION (Right) INSERTION, STENT, DRUG-ELUTING, LACRIMAL CANALICULUS (Right) as a surgical intervention.  The patient's history has been reviewed, patient examined, no change in status, stable for surgery.  I have reviewed the patient's chart and labs.  Questions were answered to the patient's satisfaction.     HARRIE AGENT

## 2024-05-05 NOTE — Anesthesia Preprocedure Evaluation (Signed)
 Anesthesia Evaluation  Patient identified by MRN, date of birth, ID band Patient awake    Reviewed: Allergy & Precautions, H&P , NPO status , Patient's Chart, lab work & pertinent test results, reviewed documented beta blocker date and time   Airway Mallampati: II  TM Distance: >3 FB Neck ROM: full    Dental  (+) Dental Advisory Given, Missing, Loose Almost all the bottom teeth missing except 2 that are very loose.  All front upper teeth missing:   Pulmonary neg pulmonary ROS   Pulmonary exam normal breath sounds clear to auscultation       Cardiovascular Exercise Tolerance: Good hypertension, Normal cardiovascular exam Rhythm:regular Rate:Normal     Neuro/Psych negative neurological ROS  negative psych ROS   GI/Hepatic Neg liver ROS,GERD  ,,IBS   Endo/Other  diabetes, Type 2    Renal/GU negative Renal ROS  negative genitourinary   Musculoskeletal   Abdominal   Peds  Hematology negative hematology ROS (+)   Anesthesia Other Findings   Reproductive/Obstetrics negative OB ROS                              Anesthesia Physical Anesthesia Plan  ASA: 2  Anesthesia Plan: MAC   Post-op Pain Management: Minimal or no pain anticipated   Induction:   PONV Risk Score and Plan: Midazolam   Airway Management Planned: Natural Airway and Nasal Cannula  Additional Equipment: None  Intra-op Plan:   Post-operative Plan:   Informed Consent: I have reviewed the patients History and Physical, chart, labs and discussed the procedure including the risks, benefits and alternatives for the proposed anesthesia with the patient or authorized representative who has indicated his/her understanding and acceptance.     Dental Advisory Given  Plan Discussed with: CRNA  Anesthesia Plan Comments:          Anesthesia Quick Evaluation

## 2024-05-05 NOTE — Transfer of Care (Signed)
 Immediate Anesthesia Transfer of Care Note  Patient: Stanley Long  Procedure(s) Performed: PHACOEMULSIFICATION, CATARACT, WITH IOL INSERTION (Right: Eye) INSERTION, STENT, DRUG-ELUTING, LACRIMAL CANALICULUS (Right: Eye)  Patient Location: Short Stay  Anesthesia Type:MAC  Level of Consciousness: awake, alert , and oriented  Airway & Oxygen Therapy: Patient Spontanous Breathing  Post-op Assessment: Report given to RN and Post -op Vital signs reviewed and stable  Post vital signs: Reviewed and stable  Last Vitals:  Vitals Value Taken Time  BP 153/79 05/05/24 08:46  Temp 36.8 C 05/05/24 08:46  Pulse 65 05/05/24 08:46  Resp 14 05/05/24 08:46  SpO2 100 % 05/05/24 08:46    Last Pain:  Vitals:   05/05/24 0846  TempSrc: Oral  PainSc: 0-No pain         Complications: No notable events documented.

## 2024-05-05 NOTE — Op Note (Signed)
 Date of procedure: 05/05/24  Pre-operative diagnosis: Visually significant age-related combined cataract, Right Eye (H25.811), Poor dilation of the right eye  Post-operative diagnosis:  Visually significant age-related combined cataract, Right Eye (H25.811) Intra-operative floppy iris syndrome, Right eye (H21.81) 3.   Pain and inflammation following cataract surgery, Right Eye (H57.11)  Procedure:  Complex Removal of cataract via phacoemulsification and insertion of intra-ocular lens Johnson and Johnson DIB00 +21.5D into the capsular bag of the Right Eye [phone]) 2. Placement of Dextenza  Implant, Right Lower Lid  Attending surgeon: Lynwood LABOR. Vi Whitesel, MD, MA  Anesthesia: MAC, Topical Akten   Complications: None  Estimated Blood Loss: <96mL (minimal)  Specimens: None  Implants: As above  Indications:  Visually significant age-related cataract, Right Eye  Procedure:  The patient was seen and identified in the pre-operative area. The operative eye was identified and dilated.  The operative eye was marked.  Topical anesthesia was administered to the operative eye.     The patient was then to the operative suite and placed in the supine position.  A timeout was performed confirming the patient, procedure to be performed, and all other relevant information.   The patient's face was prepped and draped in the usual fashion for intra-ocular surgery.  A lid speculum was placed into the operative eye and the surgical microscope moved into place and focused. Poor dilation of the iris was confirmed. An superotemporal paracentesis was created using a 20 gauge paracentesis blade.  Shugarcaine was injected into the anterior chamber.  Viscoelastic was injected into the anterior chamber.  A temporal clear-corneal main wound incision was created using a 2.53mm microkeratome.  A 7.16mm Malyugin ring was placed. A continuous curvilinear capsulorrhexis was initiated using an irrigating cystitome and completed using  capsulorrhexis forceps.  Hydrodissection and hydrodeliniation were performed.  Viscoelastic was injected into the anterior chamber.  A phacoemulsification handpiece and a chopper as a second instrument were used to remove the nucleus and epinucleus. The irrigation/aspiration handpiece was used to remove any remaining cortical material.   The capsular bag was reinflated with viscoelastic, checked, and found to be intact.  The intraocular lens was inserted into the capsular bag. The Malyugin ring was removed. The irrigation/aspiration handpiece was used to remove any remaining viscoelastic.  The clear corneal wound and paracentesis wounds were then hydrated and checked with Weck-Cels to be watertight. 0.1mL of Moxifloxacin  was injected into the anterior chamber.  The lid-speculum was removed.    The lower canaliculus was dilated and filled with Provisc. A Dextenza  implant was placed in the lower canaliculus without complication.  The drape was removed.  The patient's face was cleaned with a wet and dry 4x4.   A clear shield was taped over the eye. The patient was taken to the post-operative care unit in good condition, having tolerated the procedure well.  Post-Op Instructions: The patient will follow up at [facility] for a same day post-operative evaluation and will receive all other orders and instructions.

## 2024-05-05 NOTE — Discharge Instructions (Signed)
 Please discharge patient when stable, will follow up today with Dr. June Leap at the [facility] office immediately following discharge.  Leave shield in place until visit.  All paperwork with discharge instructions will be given at the office.  [facility] Address:  [address]  [place], [place] [address]

## 2024-05-08 ENCOUNTER — Encounter (HOSPITAL_COMMUNITY): Payer: Self-pay | Admitting: Ophthalmology

## 2024-05-08 NOTE — Anesthesia Postprocedure Evaluation (Signed)
 Anesthesia Post Note  Patient: Stanley Long  Procedure(s) Performed: PHACOEMULSIFICATION, CATARACT, WITH IOL INSERTION (Right: Eye) INSERTION, STENT, DRUG-ELUTING, LACRIMAL CANALICULUS (Right: Eye)  Patient location during evaluation: Phase II Anesthesia Type: MAC Level of consciousness: awake Pain management: pain level controlled Vital Signs Assessment: post-procedure vital signs reviewed and stable Respiratory status: spontaneous breathing and respiratory function stable Cardiovascular status: blood pressure returned to baseline and stable Postop Assessment: no headache and no apparent nausea or vomiting Anesthetic complications: no Comments: Late entry   No notable events documented.   Last Vitals:  Vitals:   05/05/24 0703 05/05/24 0846  BP: 123/74 (!) 153/79  Pulse: 68 65  Resp: 12 14  Temp: 36.8 C 36.8 C  SpO2: 98% 100%    Last Pain:  Vitals:   05/05/24 0846  TempSrc: Oral  PainSc: 0-No pain                 Yvonna JINNY Bosworth

## 2024-06-06 LAB — BASIC METABOLIC PANEL WITH GFR
BUN: 12 (ref 4–21)
Creatinine: 0.9 (ref 0.6–1.3)
Glucose: 271

## 2024-06-06 LAB — LIPID PANEL
LDL Cholesterol: 57
Triglycerides: 290 — AB (ref 40–160)

## 2024-06-06 LAB — HEMOGLOBIN A1C: Hemoglobin A1C: 10.3

## 2024-06-06 LAB — COMPREHENSIVE METABOLIC PANEL WITH GFR: eGFR: 93

## 2024-06-21 ENCOUNTER — Encounter: Admitting: Nurse Practitioner

## 2024-06-21 DIAGNOSIS — I10 Essential (primary) hypertension: Secondary | ICD-10-CM

## 2024-06-21 DIAGNOSIS — E782 Mixed hyperlipidemia: Secondary | ICD-10-CM

## 2024-06-21 DIAGNOSIS — Z7984 Long term (current) use of oral hypoglycemic drugs: Secondary | ICD-10-CM

## 2024-06-21 DIAGNOSIS — E1165 Type 2 diabetes mellitus with hyperglycemia: Secondary | ICD-10-CM

## 2024-06-21 DIAGNOSIS — Z7985 Long-term (current) use of injectable non-insulin antidiabetic drugs: Secondary | ICD-10-CM

## 2024-06-22 NOTE — Progress Notes (Signed)
 Erroneous encounter

## 2024-06-28 ENCOUNTER — Telehealth: Payer: Self-pay | Admitting: Nurse Practitioner

## 2024-06-28 LAB — COLOGUARD: COLOGUARD: NEGATIVE

## 2024-06-28 MED ORDER — ACCU-CHEK GUIDE TEST VI STRP: ORAL_STRIP | 12 refills | Status: AC

## 2024-06-28 MED ORDER — ACCU-CHEK SOFTCLIX LANCETS MISC: 12 refills | Status: AC

## 2024-06-28 MED ORDER — ACCU-CHEK GUIDE ME W/DEVICE KIT: PACK | 0 refills | Status: AC

## 2024-06-28 NOTE — Telephone Encounter (Signed)
 done

## 2024-06-28 NOTE — Telephone Encounter (Signed)
 Pt needs new meter kit called into [facility] in [place]

## 2024-08-14 ENCOUNTER — Ambulatory Visit: Admitting: Nurse Practitioner

## 2024-08-14 DIAGNOSIS — Z7985 Long-term (current) use of injectable non-insulin antidiabetic drugs: Secondary | ICD-10-CM

## 2024-08-14 DIAGNOSIS — E1165 Type 2 diabetes mellitus with hyperglycemia: Secondary | ICD-10-CM

## 2024-08-14 DIAGNOSIS — I10 Essential (primary) hypertension: Secondary | ICD-10-CM

## 2024-08-14 DIAGNOSIS — E782 Mixed hyperlipidemia: Secondary | ICD-10-CM

## 2024-08-14 DIAGNOSIS — Z7984 Long term (current) use of oral hypoglycemic drugs: Secondary | ICD-10-CM
